# Patient Record
Sex: Female | Born: 1985 | Hispanic: No | Marital: Married | Smoking: Never smoker
Health system: Southern US, Community
[De-identification: ages and names within clinical notes are randomized; demographics above are authoritative.]

## PROBLEM LIST (undated history)

## (undated) DIAGNOSIS — L309 Dermatitis, unspecified: Secondary | ICD-10-CM

## (undated) HISTORY — DX: Dermatitis, unspecified: L30.9

## (undated) HISTORY — PX: WISDOM TOOTH EXTRACTION: SHX21

---
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
## 2015-10-11 ENCOUNTER — Emergency Department (HOSPITAL_COMMUNITY)
Admission: EM | Admit: 2015-10-11 | Discharge: 2015-10-11 | Disposition: A | Discharge status: Home / Self Care | Payer: BC Managed Care – PPO | Attending: Emergency Medicine | Admitting: Emergency Medicine

## 2015-10-11 ENCOUNTER — Emergency Department (HOSPITAL_COMMUNITY): Payer: BC Managed Care – PPO

## 2015-10-11 ENCOUNTER — Encounter (HOSPITAL_COMMUNITY): Payer: Self-pay | Admitting: Emergency Medicine

## 2015-10-11 DIAGNOSIS — Y999 Unspecified external cause status: Secondary | ICD-10-CM | POA: Diagnosis not present

## 2015-10-11 DIAGNOSIS — Z79899 Other long term (current) drug therapy: Secondary | ICD-10-CM | POA: Insufficient documentation

## 2015-10-11 DIAGNOSIS — R05 Cough: Secondary | ICD-10-CM

## 2015-10-11 DIAGNOSIS — R091 Pleurisy: Secondary | ICD-10-CM | POA: Insufficient documentation

## 2015-10-11 DIAGNOSIS — Y929 Unspecified place or not applicable: Secondary | ICD-10-CM | POA: Insufficient documentation

## 2015-10-11 DIAGNOSIS — Y9389 Activity, other specified: Secondary | ICD-10-CM | POA: Diagnosis not present

## 2015-10-11 DIAGNOSIS — X58XXXA Exposure to other specified factors, initial encounter: Secondary | ICD-10-CM | POA: Insufficient documentation

## 2015-10-11 DIAGNOSIS — Z7982 Long term (current) use of aspirin: Secondary | ICD-10-CM | POA: Insufficient documentation

## 2015-10-11 DIAGNOSIS — S29011A Strain of muscle and tendon of front wall of thorax, initial encounter: Secondary | ICD-10-CM | POA: Diagnosis not present

## 2015-10-11 DIAGNOSIS — R059 Cough, unspecified: Secondary | ICD-10-CM

## 2015-10-11 DIAGNOSIS — S299XXA Unspecified injury of thorax, initial encounter: Secondary | ICD-10-CM | POA: Diagnosis present

## 2015-10-11 LAB — BASIC METABOLIC PANEL
Anion gap: 9 (ref 5–15)
BUN: 10 mg/dL (ref 6–20)
CHLORIDE: 104 mmol/L (ref 101–111)
CO2: 25 mmol/L (ref 22–32)
CREATININE: 0.79 mg/dL (ref 0.44–1.00)
Calcium: 9.3 mg/dL (ref 8.9–10.3)
GFR calc non Af Amer: >60 mL/min (ref >60)
Glucose, Bld: 112 mg/dL — ABNORMAL HIGH (ref 65–99)
POTASSIUM: 3.9 mmol/L (ref 3.5–5.1)
SODIUM: 138 mmol/L (ref 135–145)

## 2015-10-11 LAB — CBC
HEMATOCRIT: 39.5 % (ref 36.0–46.0)
Hemoglobin: 13.3 g/dL (ref 12.0–15.0)
MCH: 30.2 pg (ref 26.0–34.0)
MCHC: 33.7 g/dL (ref 30.0–36.0)
MCV: 89.8 fL (ref 78.0–100.0)
PLATELETS: 376 10*3/uL (ref 150–400)
RBC: 4.4 MIL/uL (ref 3.87–5.11)
RDW: 12.7 % (ref 11.5–15.5)
WBC: 7.2 10*3/uL (ref 4.0–10.5)

## 2015-10-11 LAB — I-STAT TROPONIN, ED: Troponin i, poc: 0 ng/mL (ref 0.00–0.08)

## 2015-10-11 LAB — D-DIMER, QUANTITATIVE (NOT AT ARMC): D DIMER QUANT: 0.44 ug{FEU}/mL (ref 0.00–0.50)

## 2015-10-11 MED ORDER — IBUPROFEN 800 MG PO TABS: 800.0000 mg | ORAL_TABLET | Freq: Three times a day (TID) | ORAL | 0 refills | Status: AC

## 2015-10-11 MED ORDER — HYDROCODONE-HOMATROPINE 5-1.5 MG/5ML PO SYRP: 5.0000 mL | ORAL_SOLUTION | Freq: Four times a day (QID) | ORAL | 0 refills | Status: DC | PRN

## 2015-10-11 NOTE — ED Triage Notes (Signed)
Patient states that had left chest/ lateral rib pain x 5 days, thought it was from where she was coughing a lot. [facility] and was sent here for elevated D-dimer.

## 2015-10-11 NOTE — ED Notes (Signed)
PA at bedside.

## 2015-10-11 NOTE — ED Provider Notes (Signed)
[facility]-EMERGENCY DEPT Provider Note   CSN: [id number] Arrival date & time: 10/11/15  1451     History   Chief Complaint Chief Complaint  Patient presents with  . Chest Pain  . elevated D-dimer    HPI Jill Dawson is a 30 y.o. female.  Patient presents to the emergency department with chief complaint of cough and cold symptoms. She states that she has some left-sided chest pain when she is coughing. The pain is reproducible with movement and palpation. She denies any fevers chills. Denies shortness of breath. She denies any recent travel, surgery, or immobilization. Denies any history of PE or DVT. She denies any lower extremity swelling. She states that she does use oral contraception. She states that she was sent to the emergency department by [facility] for a d-dimer abnormality.   The history is provided by the patient. No language interpreter was used.    History reviewed. No pertinent past medical history.  There are no active problems to display for this patient.   History reviewed. No pertinent surgical history.  OB History    No data available       Home Medications    Prior to Admission medications   Medication Sig Start Date End Date Taking? Authorizing Provider  aspirin-acetaminophen-caffeine (EXCEDRIN MIGRAINE) [id number] MG tablet Take 2 tablets by mouth every 6 (six) hours as needed (chest pain.).   Yes Historical Provider, MD  cetirizine (ZYRTEC) 10 MG tablet Take 10 mg by mouth daily.   Yes Historical Provider, MD  ketotifen (ZADITOR) 0.025 % ophthalmic solution Place 2 drops into both eyes daily.   Yes Historical Provider, MD  Norethin Ace-Eth Estrad-FE (TAYTULLA) 1-20 MG-MCG(24) CAPS Take by mouth.   Yes Historical Provider, MD    Family History No family history on file.  Social History Social History  Substance Use Topics  . Smoking status: Never Smoker  . Smokeless tobacco: Not on file  . Alcohol use No     Allergies   Review of  patient's allergies indicates no known allergies.   Review of Systems Review of Systems  Respiratory: Negative for shortness of breath.   Cardiovascular: Positive for chest pain. Negative for leg swelling.  All other systems reviewed and are negative.    Physical Exam Updated Vital Signs BP 138/87 (BP Location: Right Arm)   Pulse 68   Temp 98.4 F (36.9 C)   Resp 13   Ht 5\' 7"  (1.702 m)   Wt 74.4 kg   SpO2 100%   BMI 25.69 kg/m   Physical Exam  Constitutional: She is oriented to person, place, and time. She appears well-developed and well-nourished.  HENT:  Head: Normocephalic and atraumatic.  Eyes: Conjunctivae and EOM are normal. Pupils are equal, round, and reactive to light.  Neck: Normal range of motion. Neck supple.  Cardiovascular: Normal rate and regular rhythm.  Exam reveals no gallop and no friction rub.   No murmur heard. Pulmonary/Chest: Effort normal and breath sounds normal. No respiratory distress. She has no wheezes. She has no rales. She exhibits no tenderness.  Clear to auscultation bilaterally  Abdominal: Soft. Bowel sounds are normal. She exhibits no distension and no mass. There is no tenderness. There is no rebound and no guarding.  Musculoskeletal: Normal range of motion. She exhibits no edema or tenderness.  No lower extremity swelling  Neurological: She is alert and oriented to person, place, and time.  Skin: Skin is warm and dry.  Psychiatric: She has a normal  mood and affect. Her behavior is normal. Judgment and thought content normal.  Nursing note and vitals reviewed.    ED Treatments / Results  Labs (all labs ordered are listed, but only abnormal results are displayed) Labs Reviewed  BASIC METABOLIC PANEL - Abnormal; Notable for the following:       Result Value   Glucose, Bld 112 (*)    All other components within normal limits  CBC  D-DIMER, QUANTITATIVE (NOT [facility])  Rosezena SensorI-STAT TROPOININ, ED    EKG  EKG Interpretation None         Radiology Dg Chest 2 View  Result Date: 10/11/2015 CLINICAL DATA:  Five day history of left-sided chest pain and cough. EXAM: CHEST  2 VIEW COMPARISON:  None. FINDINGS: The heart size and mediastinal contours are within normal limits. Both lungs are clear. The visualized skeletal structures are unremarkable. IMPRESSION: Normal chest x-ray. Electronically Signed   By: Rudie MeyerP.  Gallerani M.D.   On: 10/11/2015 15:53    Procedures Procedures (including critical care time)  Medications Ordered in ED Medications - No data to display   Initial Impression / Assessment and Plan / ED Course  I have reviewed the triage vital signs and the nursing notes.  Pertinent labs & imaging results that were available during my care of the patient were reviewed by me and considered in my medical decision making (see chart for details).  Clinical Course    [facility] having [facility] abnormal d-dimer. Her d-dimer to the emergency department is 0.44, which is normal. I have a very low suspicion of PE in this patient. She does not have any shortness of breath and tachycardia. Her symptoms are reproducible with palpation and when she is coughing. I suspect that her symptoms are more related to intercostal straining and possible pleurisy. Her laboratory workup is completely unremarkable.  I discussed the patient with Dr. Freida BusmanAllen, who agrees with the plan.  Final Clinical Impressions(s) / ED Diagnoses   Final diagnoses:  Intercostal muscle strain, initial encounter  Cough  Pleurisy    New Prescriptions New Prescriptions   HYDROCODONE-HOMATROPINE (HYCODAN) 5-1.5 MG/5ML SYRUP    Take 5 mLs by mouth every 6 (six) hours as needed for cough.   IBUPROFEN (ADVIL,MOTRIN) 800 MG TABLET    Take 1 tablet (800 mg total) by mouth 3 (three) times daily.     Roxy HorsemanRobert Lilie Vezina, PA-C 10/11/15 1914    Lorre NickAnthony Allen, MD 10/12/15 [phone]

## 2016-10-21 ENCOUNTER — Ambulatory Visit (INDEPENDENT_AMBULATORY_CARE_PROVIDER_SITE_OTHER): Payer: Self-pay | Admitting: Allergy and Immunology

## 2016-10-21 ENCOUNTER — Encounter: Payer: Self-pay | Admitting: Allergy and Immunology

## 2016-10-21 VITALS — BP 112/68 | HR 72 | Temp 98.7°F | Resp 16 | Ht 68.0 in | Wt 167.4 lb

## 2016-10-21 DIAGNOSIS — H1010 Acute atopic conjunctivitis, unspecified eye: Secondary | ICD-10-CM | POA: Insufficient documentation

## 2016-10-21 DIAGNOSIS — L500 Allergic urticaria: Secondary | ICD-10-CM | POA: Diagnosis not present

## 2016-10-21 DIAGNOSIS — J3089 Other allergic rhinitis: Secondary | ICD-10-CM | POA: Diagnosis not present

## 2016-10-21 DIAGNOSIS — H1013 Acute atopic conjunctivitis, bilateral: Secondary | ICD-10-CM

## 2016-10-21 MED ORDER — LEVOCETIRIZINE DIHYDROCHLORIDE 5 MG PO TABS: 5.0000 mg | ORAL_TABLET | Freq: Every evening | ORAL | 5 refills | Status: DC

## 2016-10-21 MED ORDER — FLUTICASONE PROPIONATE 93 MCG/ACT NA EXHU: 2.0000 | INHALANT_SUSPENSION | Freq: Two times a day (BID) | NASAL | 3 refills | Status: AC

## 2016-10-21 MED ORDER — OLOPATADINE HCL 0.7 % OP SOLN: 1.0000 [drp] | Freq: Every day | OPHTHALMIC | 5 refills | Status: AC

## 2016-10-21 NOTE — Patient Instructions (Addendum)
Allergic rhinitis with possible nonallergic component  Aeroallergen avoidance measures have been discussed and provided in written form.  A prescription [facility] been [facility], 2 actuations per nostril twice a day. Proper technique has been discussed and demonstrated.  I have also recommended nasal saline spray (i.e., Simply Saline) or nasal saline lavage (i.e., NeilMed) as needed and prior to medicated nasal sprays.  For thick post nasal drainage, nasal congestion, and/or sinus pressure, add guaifenesin 1200 mg (Mucinex Maximum Strength) plus/minus pseudoephedrine 120 mg  twice daily as needed with adequate hydration as discussed. Pseudoephedrine is only to be used for short-term relief of nasal/sinus congestion. Long-term use is discouraged due to potential side effects.  If allergen avoidance measures and medications fail to adequately relieve symptoms, aeroallergen immunotherapy will be considered.  Allergic conjunctivitis  Treatment plan as outlined above for allergic rhinitis.  A prescription has been provided for Pazeo, one drop per eye daily as needed.  I have also recommended eye lubricant drops (i.e., Natural Tears) as needed.  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 There are no concomitant symptoms concerning for anaphylaxis or constitutional symptoms worrisome for an underlying malignancy.   We will not order labs at this time, however, if lesions progress or change in character, we will assess other potential etiologies with screening labs.  For symptom relief, patient is to take oral antihistamines as directed.  Instructions have been discussed and provided for H1/H2 receptor blockade with titration to find lowest effective dose.  A prescription has been provided for levocetirizine, 5mg  daily as needed.  Should significant or new symptoms occur, a journal is to be kept recording any foods eaten, beverages  consumed, medications taken within a 6 hour period prior to the onset of symptoms, as well as record activities being performed, and environmental conditions. For any symptoms concerning for anaphylaxis, 911 is to be called immediately.   Return in about 3 months (around 01/20/2017), or if symptoms worsen or fail to improve.   Urticaria (Hives)  . Levocetirizine (Xyzal) 5 mg twice a day and ranitidine (Zantac) 150 mg twice a day. If no symptoms for 7-14 days then decrease to. . Levocetirizine (Xyzal) 5 mg twice a day and ranitidine (Zantac) 150 mg once a day.  If no symptoms for 7-14 days then decrease to. . Levocetirizine (Xyzal) 5 mg twice a day.  If no symptoms for 7-14 days then decrease to. . Levocetirizine (Xyzal) 5 mg once a day.  May use Benadryl (diphenhydramine) as needed for breakthrough symptoms       If symptoms return, then step up dosage    Reducing Pollen Exposure  The American Academy of Allergy, Asthma and Immunology suggests the following steps to reduce your exposure to pollen during allergy seasons.    1. Do not hang sheets or clothing out to dry; pollen may collect on these items. 2. Do not mow lawns or spend time around freshly cut grass; mowing stirs up pollen. 3. Keep windows closed at night.  Keep car windows closed while driving. 4. Minimize morning activities outdoors, a time when pollen counts are usually at their highest. 5. Stay indoors as much as possible when pollen counts or humidity is high and on windy days when pollen tends to remain in the air longer. 6. Use air conditioning when possible.  Many air conditioners have filters that trap the pollen spores. 7. Use a HEPA room air filter to remove pollen form the indoor air you breathe.  Control of House Dust Mite Allergen  House dust mites play a major role in allergic asthma and rhinitis.  They occur in environments with high humidity wherever human skin, the food for dust mites is found. High  levels have been detected in dust obtained from mattresses, pillows, carpets, upholstered furniture, bed covers, clothes and soft toys.  The principal allergen of the house dust mite is found in its feces.  A gram of dust may contain 1,000 mites and 250,000 fecal particles.  Mite antigen is easily measured in the air during house cleaning activities.    1. Encase mattresses, including the box spring, and pillow, in an air tight cover.  Seal the zipper end of the encased mattresses with wide adhesive tape. 2. Wash the bedding in water of 130 degrees Farenheit weekly.  Avoid cotton comforters/quilts and flannel bedding: the most ideal bed covering is the dacron comforter. 3. Remove all upholstered furniture from the bedroom. 4. Remove carpets, carpet padding, rugs, and non-washable window drapes from the bedroom.  Wash drapes weekly or use plastic window coverings. 5. Remove all non-washable stuffed toys from the bedroom.  Wash stuffed toys weekly. 6. Have the room cleaned frequently with a vacuum cleaner and a damp dust-mop.  The patient should not be in a room which is being cleaned and should wait 1 hour after cleaning before going into the room. 7. Close and seal all heating outlets in the bedroom.  Otherwise, the room will become filled with dust-laden air.  An electric heater can be used to heat the room. 8. Reduce indoor humidity to less than 50%.  Do not use a humidifier.  Control of Dog or Cat Allergen  Avoidance is the best way to manage a dog or cat allergy. If you have a dog or cat and are allergic to dog or cats, consider removing the dog or cat from the home. If you have a dog or cat but don't want to find it a new home, or if your family wants a pet even though someone in the household is allergic, here are some strategies that may help keep symptoms at bay:  1. Keep the pet out of your bedroom and restrict it to only a few rooms. Be advised that keeping the dog or cat in only one room  will not limit the allergens to that room. 2. Don't pet, hug or kiss the dog or cat; if you do, wash your hands with soap and water. 3. High-efficiency particulate air (HEPA) cleaners run continuously in a bedroom or living room can reduce allergen levels over time. 4. Place electrostatic material sheet in the air inlet vent in the bedroom. 5. Regular use of a high-efficiency vacuum cleaner or a central vacuum can reduce allergen levels. 6. Giving your dog or cat a bath at least once a week can reduce airborne allergen.  Control of Mold Allergen  Mold and fungi can grow on a variety of surfaces provided certain temperature and moisture conditions exist.  Outdoor molds grow on plants, decaying vegetation and soil.  The major outdoor mold, Alternaria and Cladosporium, are found in very high numbers during hot and dry conditions.  Generally, a late Summer - Fall peak is seen for common outdoor fungal spores.  Rain will temporarily lower outdoor mold spore count, but counts rise rapidly when the rainy period ends.  The most important indoor molds are Aspergillus and Penicillium.  Dark, humid and poorly ventilated basements are ideal sites for mold growth.  The next most common sites of mold growth are the bathroom and the kitchen.  Outdoor [employer] 1. Use air conditioning and keep windows closed 2. Avoid exposure to decaying vegetation. 3. Avoid leaf raking. 4. Avoid grain handling. 5. Consider wearing a face mask if working in moldy areas.  Indoor Mold Control 1. Maintain humidity below 50%. 2. Clean washable surfaces with 5% bleach solution. 3. Remove sources e.g. Contaminated carpets.  Control of Cockroach Allergen  Cockroach allergen has been identified as an important cause of acute attacks of asthma, especially in urban settings.  There are fifty-five species of cockroach that exist in the [place], however only three, the [place], [place] species produce allergen  that can affect patients with Asthma.  Allergens can be obtained from fecal particles, egg casings and secretions from cockroaches.    1. Remove food sources. 2. Reduce access to water. 3. Seal access and entry points. 4. Spray runways with 0.5-1% Diazinon or Chlorpyrifos 5. Blow boric acid power under stoves and refrigerator. 6. Place bait stations (hydramethylnon) at feeding sites.

## 2016-10-21 NOTE — Assessment & Plan Note (Signed)
   Aeroallergen avoidance measures have been discussed and provided in written form.  A prescription has been [facility], 2 actuations per nostril twice a day. Proper technique has been discussed and demonstrated.  I have also recommended nasal saline spray (i.e., Simply Saline) or nasal saline lavage (i.e., NeilMed) as needed and prior to medicated nasal sprays.  For thick post nasal drainage, nasal congestion, and/or sinus pressure, add guaifenesin 1200 mg (Mucinex Maximum Strength) plus/minus pseudoephedrine 120 mg  twice daily as needed with adequate hydration as discussed. Pseudoephedrine is only to be used for short-term relief of nasal/sinus congestion. Long-term use is discouraged due to potential side effects.  If allergen avoidance measures and medications fail to adequately relieve symptoms, aeroallergen immunotherapy will be considered.

## 2016-10-21 NOTE — Progress Notes (Signed)
New Patient Note  RE: Jill Dawson MRN: [id number] DOB: 1985/05/09 Date of Office Visit: 10/21/2016  Referring provider: No ref. provider found Primary care provider: Patient, No Pcp Per  Chief Complaint: Sinus Problem; Nasal Congestion; Pruritus; and Urticaria   History of present illness: Jill DaubVictoria Dawson is a 31 y.o. female presenting today for evaluation of rhinosinusitis, rash, and pruritus.  Over the past 10 years she has experienced recurrent sinus pressure and nasal congestion.  The sinus pressure primarily occurs over her forehead and between her eyes.  She states that if she is unable to resolve the congestion and pressure she will progressed into a "full blown" migraine headache.  Specific triggers for the congestion and sinus pressure include exposure to pollen, dust, cigarette smoke, and rapid weather changes.  She also experiences rhinorrhea, sneezing, nasal pruritus, and ocular pruritus.  These specific symptoms are most frequent and severe during the spring and fall with pollen exposure.  She attempts to control her nasal, sinus, and ocular symptoms with loratadine daily, Zaditor as needed, pseudoephedrine as needed, and fluticasone nasal spray sporadically. She reports that since July 4 she has experienced consistent pruritus as well as occasional small, red, raised bumps on her neck.  Since that time her eczema has been exacerbated as well.  She has had eczema since infancy and is followed by a dermatologist for this problem.  The eczema typically involves her eyelids, neck, and upper extremities.  She was started on Cloderm last week but it is too early to assess benefit.  She also notes that when she scratches her skin she develops linear welts or, if she is carrying a heavy purse/bag, often times hives develop at the point of pressure from the strap.  She does not experience concomitant angioedema, cardiopulmonary symptoms, or GI symptoms.   Assessment and  plan: Allergic rhinitis with possible nonallergic component  Aeroallergen avoidance measures have been discussed and provided in written form.  [facility] 2 actuations per nostril twice a day. Proper technique has been discussed and demonstrated.  I have also recommended nasal saline spray (i.e., Simply Saline) or nasal saline lavage (i.e., NeilMed) as needed and prior to medicated nasal sprays.  For thick post nasal drainage, nasal congestion, and/or sinus pressure, add guaifenesin 1200 mg (Mucinex Maximum Strength) plus/minus pseudoephedrine 120 mg  twice daily as needed with adequate hydration as discussed. Pseudoephedrine is only to be used for short-term relief of nasal/sinus congestion. Long-term use is discouraged due to potential side effects.  If allergen avoidance measures and medications fail to adequately relieve symptoms, aeroallergen immunotherapy will be considered.  Allergic conjunctivitis  Treatment plan as outlined above for allergic rhinitis.  A prescription has been provided for Pazeo, one drop per eye daily as needed.  I have also recommended eye lubricant drops (i.e., Natural Tears) as needed.  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 There are no concomitant symptoms concerning for anaphylaxis or constitutional symptoms worrisome for an underlying malignancy.   We will not order labs at this time, however, if lesions progress or change in character, we will assess other potential etiologies with screening labs.  For symptom relief, patient is to take oral antihistamines as directed.  Instructions have been discussed and provided for H1/H2 receptor blockade with titration to find lowest effective dose.  A prescription has been provided for levocetirizine, 5mg  daily as needed.  Should significant or new symptoms occur, a journal is to be kept  recording any foods eaten,  beverages consumed, medications taken within a 6 hour period prior to the onset of symptoms, as well as record activities being performed, and environmental conditions. For any symptoms concerning for anaphylaxis, 911 is to be called immediately.   Meds ordered this encounter  Medications  . Fluticasone Propionate (XHANCE) 93 MCG/ACT EXHU    Sig: Place 2 sprays into the nose 2 (two) times daily.    Dispense:  16 mL    Refill:  3    [phone]  . levocetirizine (XYZAL) 5 MG tablet    Sig: Take 1 tablet (5 mg total) by mouth every evening.    Dispense:  30 tablet    Refill:  5  . Olopatadine HCl (PAZEO) 0.7 % SOLN    Sig: Apply 1 drop to eye daily.    Dispense:  2.5 mL    Refill:  5    Diagnostics: Environmental skin testing: Positive to ragweed pollen, weed pollen, tree pollen, molds, cat hair, dog epithelia, cockroach antigen, and dust mite antigen. Food allergen skin testing:  Negative despite a positive histamine control.    Physical examination: Blood pressure 112/68, pulse 72, temperature 98.7 F (37.1 C), temperature source Oral, resp. rate 16, height 5\' 8"  (1.727 m), weight 167 lb 6.4 oz (75.9 kg).  General: Alert, interactive, in no acute distress. HEENT: TMs pearly gray, turbinates moderately edematous without discharge, post-pharynx unremarkable. Neck: Supple without lymphadenopathy. Lungs: Clear to auscultation without wheezing, rhonchi or rales. CV: Normal S1, S2 without murmurs. Abdomen: Nondistended, nontender. Skin: Warm and dry, without lesions or rashes. Extremities:  No clubbing, cyanosis or edema. Neuro:   Grossly intact.  Review of systems:  Review of systems negative except as noted in HPI / PMHx or noted below: Review of Systems  Constitutional: Negative.   HENT: Negative.   Eyes: Negative.   Respiratory: Negative.   Cardiovascular: Negative.   Gastrointestinal: Negative.   Genitourinary: Negative.   Musculoskeletal: Negative.   Skin: Negative.    Neurological: Negative.   Endo/Heme/Allergies: Negative.   Psychiatric/Behavioral: Negative.     Past medical history:  Past Medical History:  Diagnosis Date  . Eczema     Past surgical history:  Past Surgical History:  Procedure Laterality Date  . WISDOM TOOTH EXTRACTION      Family history: Family History  Problem Relation Age of Onset  . Allergic rhinitis Mother   . Allergic rhinitis Father   . Angioedema Neg Hx   . Asthma Neg Hx   . Eczema Neg Hx   . Immunodeficiency Neg Hx   . Urticaria Neg Hx     Social history: Social History   Social History  . Marital status: Married    Spouse name: N/A  . Number of children: N/A  . Years of education: N/A   Occupational History  . Not on file.   Social History Main Topics  . Smoking status: Never Smoker  . Smokeless tobacco: Never Used  . Alcohol use No  . Drug use: No  . Sexual activity: Not on file   Other Topics Concern  . Not on file   Social History Narrative  . No narrative on file   Environmental History: The patient lives in a house built in [place] with hardwood floors throughout and central air/heat.  There are 2 dogs in the home which have access to her bedroom.  Mold has been identified, however only in the basement.  She is a nonsmoker.  Allergies as  of 10/21/2016   No Known Allergies     Medication List       Accurate as of 10/21/16  1:22 PM. Always use your most recent med list.          aspirin-acetaminophen-caffeine 250-250-65 MG tablet Commonly known as:  EXCEDRIN MIGRAINE Take 2 tablets by mouth every 6 (six) hours as needed (chest pain.).   Clocortolone Pivalate 0.1 % cream Commonly known as:  CLODERM   Fluticasone Propionate 93 MCG/ACT Exhu Commonly known as:  XHANCE Place 2 sprays into the nose 2 (two) times daily.   ibuprofen 800 MG tablet Commonly known as:  ADVIL,MOTRIN Take 1 tablet (800 mg total) by mouth 3 (three) times daily.   ketotifen 0.025 % ophthalmic  solution Commonly known as:  ZADITOR Place 2 drops into both eyes daily.   levocetirizine 5 MG tablet Commonly known as:  XYZAL Take 1 tablet (5 mg total) by mouth every evening.   loratadine 10 MG tablet Commonly known as:  CLARITIN Take 10 mg by mouth daily.   Olopatadine HCl 0.7 % Soln Commonly known as:  PAZEO Apply 1 drop to eye daily.   TAYTULLA 1-20 MG-MCG(24) Caps Generic drug:  Norethin Ace-Eth Estrad-FE Take by mouth.            Discharge Care Instructions        Start     Ordered   10/21/16 0000  Allergy Test    Question:  Allergy test to perform  Answer:  1-59 airborne; 1-30 child food   10/21/16 1219   10/21/16 0000  Interdermal Allergy Test    Question Answer Comment  Allergens Control   Allergens [place]   Allergens Johnson   Allergens 7 Grass   Allergens Ragweed Mix   Allergens Weed Mix   Allergens Tree Mix   Allergens Mold 1   Allergens Mold 2   Allergens Mold 3   Allergens Mold 4   Allergens Cat   Allergens Dog   Allergens Cockroach   Allergens Mite Mix      10/21/16 1219   10/21/16 0000  Fluticasone Propionate (XHANCE) 93 MCG/ACT EXHU  2 times daily    Comments:  [id number]   10/21/16 1219   10/21/16 0000  levocetirizine (XYZAL) 5 MG tablet  Every evening     10/21/16 1219   10/21/16 0000  Olopatadine HCl (PAZEO) 0.7 % SOLN  Daily     10/21/16 1219      Known medication allergies: No Known Allergies  I appreciate the opportunity to take part in [place]'s care. Please do not hesitate to contact me with questions.  Sincerely,   R. Jorene Guest, MD

## 2016-10-21 NOTE — Assessment & Plan Note (Signed)
   Treatment plan as outlined above for allergic rhinitis.  A prescription has been provided for Pazeo, one drop per eye daily as needed.  I have also recommended eye lubricant drops (i.e., Natural Tears) as needed. 

## 2016-10-21 NOTE — Assessment & Plan Note (Addendum)
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 There are no concomitant symptoms concerning for anaphylaxis or constitutional symptoms worrisome for an underlying malignancy.   We will not order labs at this time, however, if lesions progress or change in character, we will assess other potential etiologies with screening labs.  For symptom relief, patient is to take oral antihistamines as directed.  Instructions have been discussed and provided for H1/H2 receptor blockade with titration to find lowest effective dose.  A prescription has been provided for levocetirizine, 5mg  daily as needed.  Should significant or new symptoms occur, a journal is to be kept recording any foods eaten, beverages consumed, medications taken within a 6 hour period prior to the onset of symptoms, as well as record activities being performed, and environmental conditions. For any symptoms concerning for anaphylaxis, 911 is to be called immediately.

## 2017-01-19 ENCOUNTER — Encounter: Payer: Self-pay | Admitting: Allergy and Immunology

## 2017-01-19 ENCOUNTER — Ambulatory Visit: Payer: BC Managed Care – PPO | Admitting: Allergy and Immunology

## 2017-01-19 VITALS — BP 117/75 | HR 75 | Temp 98.0°F | Resp 16

## 2017-01-19 DIAGNOSIS — L500 Allergic urticaria: Secondary | ICD-10-CM | POA: Diagnosis not present

## 2017-01-19 DIAGNOSIS — L209 Atopic dermatitis, unspecified: Secondary | ICD-10-CM | POA: Insufficient documentation

## 2017-01-19 DIAGNOSIS — H1013 Acute atopic conjunctivitis, bilateral: Secondary | ICD-10-CM | POA: Diagnosis not present

## 2017-01-19 DIAGNOSIS — J3089 Other allergic rhinitis: Secondary | ICD-10-CM

## 2017-01-19 DIAGNOSIS — L2089 Other atopic dermatitis: Secondary | ICD-10-CM

## 2017-01-19 MED ORDER — LEVOCETIRIZINE DIHYDROCHLORIDE 5 MG PO TABS: 5.0000 mg | ORAL_TABLET | Freq: Every evening | ORAL | 5 refills | Status: DC

## 2017-01-19 NOTE — Assessment & Plan Note (Signed)
Quiescent in the interval since her previous visit.  Continue levocetirizine or fexofenadine if needed.  Should significant symptoms recur, a journal is to be kept recording any foods eaten, beverages consumed, and medications taken within a 6 hour time period prior to the onset of symptoms, as well as record activities being performed, and environmental conditions. For any symptoms concerning for anaphylaxis, 911 is to be called immediately.

## 2017-01-19 NOTE — Progress Notes (Signed)
Follow-up Note  RE: Jill Dawson MRN: [id number] DOB: 1985/04/05 Date of Office Visit: 01/19/2017  Primary care provider: Patient, No Pcp Per Referring provider: No ref. provider found  History of present illness: Jill Dawson is a 31 y.o. female with allergic rhinoconjunctivitis and history of urticaria presenting today for follow-up.  She was previously seen in this clinic for her initial evaluation on October 21, 2016.  Ports that her allergic rhinitis symptoms have been well controlled with Xyzal or Allegra as well as sporadic use of Xhance.  She is using nasal saline irrigation as well.  She has noticed since our discussion on her previous visit that barometric pressure changes seem to trigger her sinus headaches.  She has had no recurrence of urticaria in the interval since her previous visit.  She reports that her eczema has been occurring more frequently this year, particularly on her neck and back.  She is using Cloderm as needed and moisturizes her skin appropriately.   Assessment and plan: Allergic rhinitis with possible nonallergic component  Continue appropriate allergen avoidance measures, levocetirizine (Xyzal) 5 mg daily as needed or fexofenadine (Allegra) 180 mg daily as needed, nasal saline irrigation, and Xhance as needed.  Atopic dermatitis  Continue appropriate skin care measures and Cloderm 0.1% cream to affected areas as needed.  Recurrent urticaria Quiescent in the interval since her previous visit.  Continue levocetirizine or fexofenadine if needed.  Should significant symptoms recur, a journal is to be kept recording any foods eaten, beverages consumed, and medications taken within a 6 hour time period prior to the onset of symptoms, as well as record activities being performed, and environmental conditions. For any symptoms concerning for anaphylaxis, 911 is to be called immediately.   Meds ordered this encounter  Medications  . levocetirizine  (XYZAL) 5 MG tablet    Sig: Take 1 tablet (5 mg total) by mouth every evening.    Dispense:  30 tablet    Refill:  5    Physical examination: Blood pressure 117/75, pulse 75, temperature 98 F (36.7 C), temperature source Oral, resp. rate 16, SpO2 98 %.  General: Alert, interactive, in no acute distress. HEENT: TMs pearly gray, turbinates mildly edematous without discharge, post-pharynx unremarkable. Neck: Supple without lymphadenopathy. Lungs: Clear to auscultation without wheezing, rhonchi or rales. CV: Normal S1, S2 without murmurs. Skin: Warm and dry, without lesions or rashes.  The following portions of the patient's history were reviewed and updated as appropriate: allergies, current medications, past family history, past medical history, past social history, past surgical history and problem list.  Allergies as of 01/19/2017   No Known Allergies     Medication List        Accurate as of 01/19/17 10:52 AM. Always use your most recent med list.          amoxicillin-clavulanate 875-125 MG tablet Commonly known as:  AUGMENTIN   aspirin-acetaminophen-caffeine 250-250-65 MG tablet Commonly known as:  EXCEDRIN MIGRAINE Take 2 tablets by mouth every 6 (six) hours as needed (chest pain.).   BOOSTRIX 5-2.5-18.5 LF-MCG/0.5 injection Generic drug:  Tdap ADM 0.5ML IM UTD   Clocortolone Pivalate 0.1 % cream Commonly known as:  CLODERM   Fluticasone Propionate 93 MCG/ACT Exhu Commonly known as:  XHANCE Place 2 sprays into the nose 2 (two) times daily.   FLUZONE QUADRIVALENT 0.5 ML injection Generic drug:  Influenza vac split quadrivalent PF   ibuprofen 800 MG tablet Commonly known as:  ADVIL,MOTRIN Take 1 tablet (800 mg  total) by mouth 3 (three) times daily.   ketotifen 0.025 % ophthalmic solution Commonly known as:  ZADITOR Place 2 drops into both eyes daily.   levocetirizine 5 MG tablet Commonly known as:  XYZAL Take 1 tablet (5 mg total) by mouth every  evening.   loratadine 10 MG tablet Commonly known as:  CLARITIN Take 10 mg by mouth daily.   Olopatadine HCl 0.7 % Soln Commonly known as:  PAZEO Apply 1 drop to eye daily.   TAYTULLA 1-20 MG-MCG(24) Caps Generic drug:  Norethin Ace-Eth Estrad-FE Take by mouth.       No Known Allergies  I appreciate the opportunity to take part in [name]'s care. Please do not hesitate to contact me with questions.  Sincerely,   R. Jorene Guestarter Raelyn Racette, MD

## 2017-01-19 NOTE — Assessment & Plan Note (Signed)
   Continue appropriate allergen avoidance measures, levocetirizine (Xyzal) 5 mg daily as needed or fexofenadine (Allegra) 180 mg daily as needed, nasal saline irrigation, and Xhance as needed.

## 2017-01-19 NOTE — Patient Instructions (Signed)
Allergic rhinitis with possible nonallergic component  Continue appropriate allergen avoidance measures, levocetirizine (Xyzal) 5 mg daily as needed or fexofenadine (Allegra) 180 mg daily as needed, nasal saline irrigation, and Xhance as needed.  Atopic dermatitis  Continue appropriate skin care measures and Cloderm 0.1% cream to affected areas as needed.  Recurrent urticaria Quiescent in the interval since her previous visit.  Continue levocetirizine or fexofenadine if needed.  Should significant symptoms recur, a journal is to be kept recording any foods eaten, beverages consumed, and medications taken within a 6 hour time period prior to the onset of symptoms, as well as record activities being performed, and environmental conditions. For any symptoms concerning for anaphylaxis, 911 is to be called immediately.   Return in about 6 months (around 07/20/2017), or if symptoms worsen or fail to improve.

## 2017-01-19 NOTE — Assessment & Plan Note (Signed)
   Continue appropriate skin care measures and Cloderm 0.1% cream to affected areas as needed.

## 2017-05-28 IMAGING — CR DG CHEST 2V
2 series · 2 of 2 positions shown · non-contrast
Comparison: None.

CLINICAL DATA: Five day history of left-sided chest pain and cough.

EXAM:
CHEST  2 VIEW

[w chest lat]
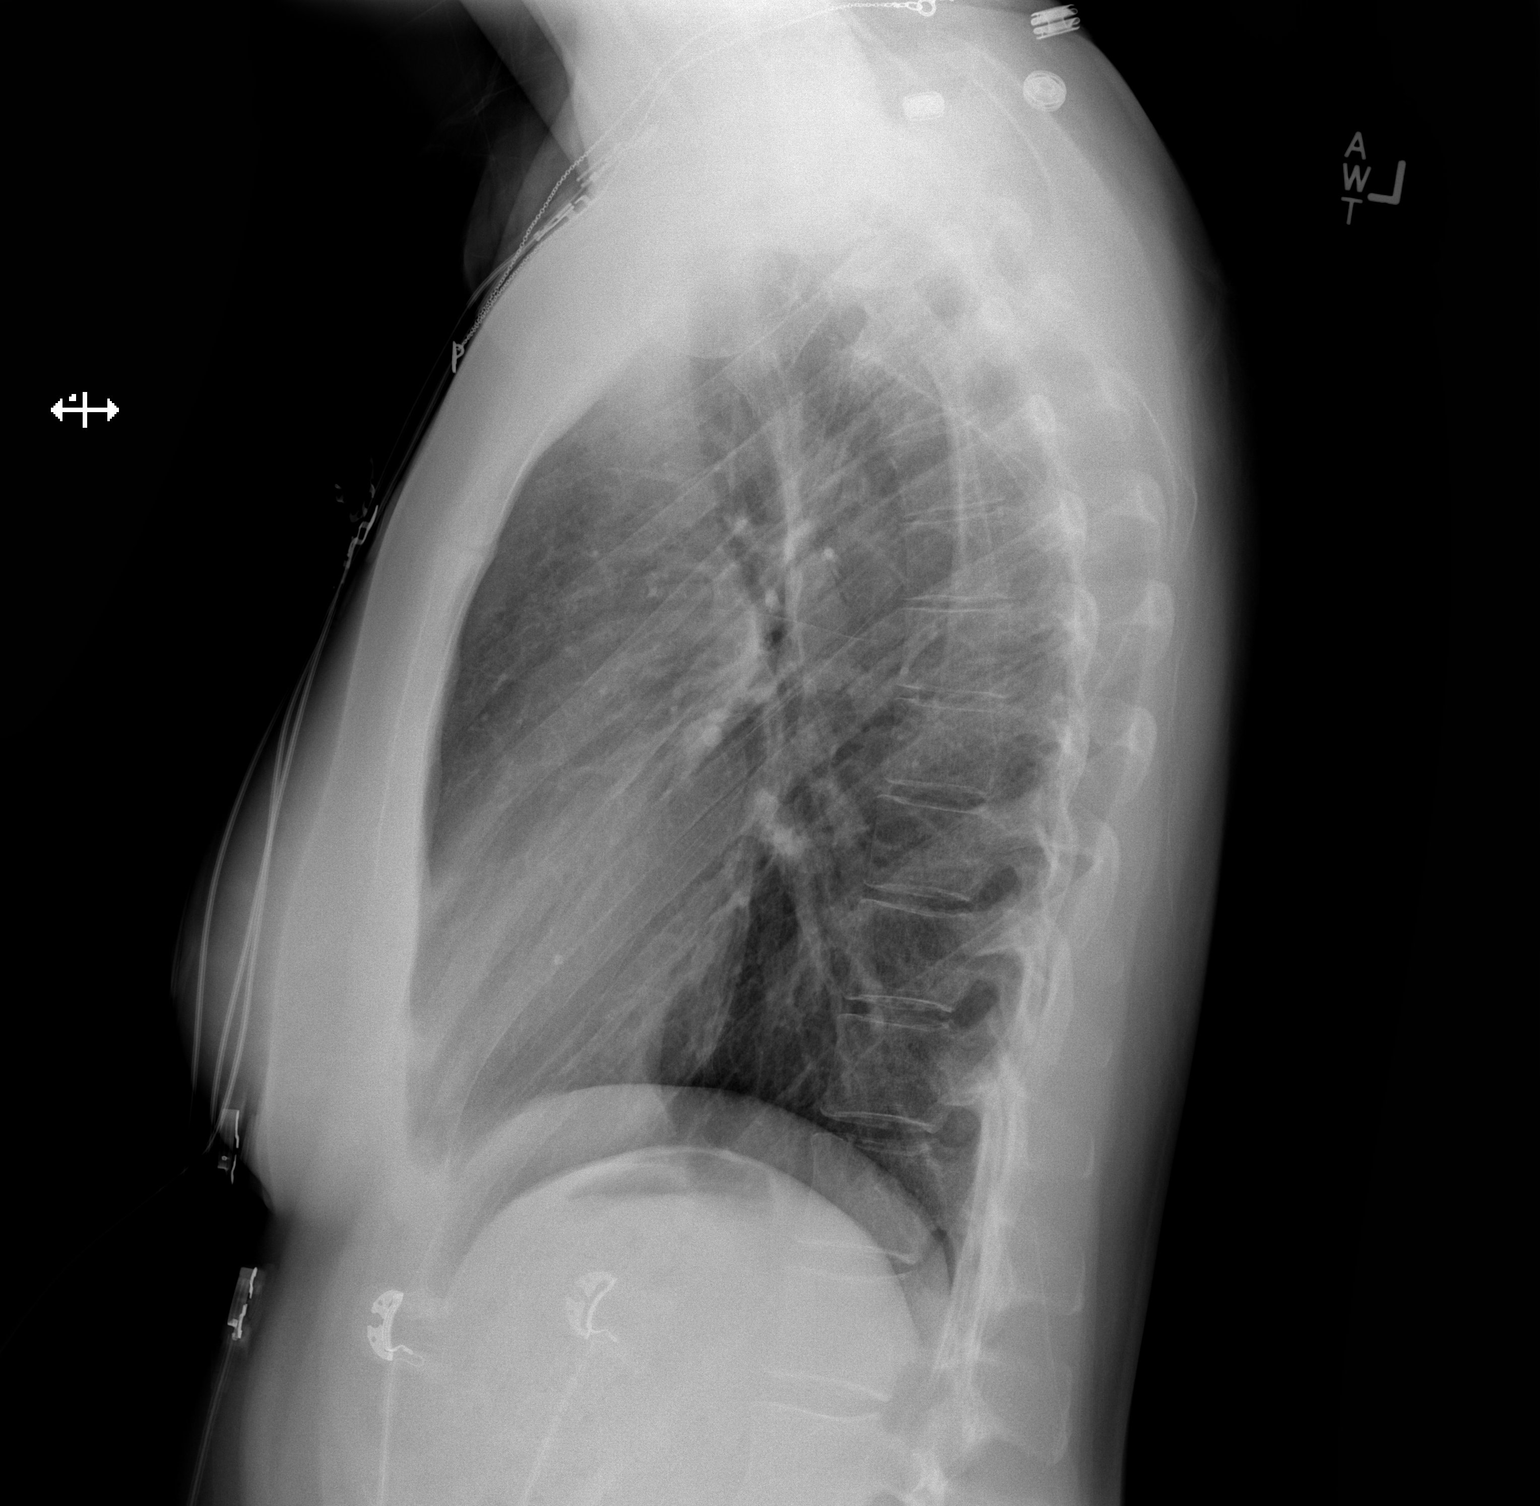

[w chest pa]
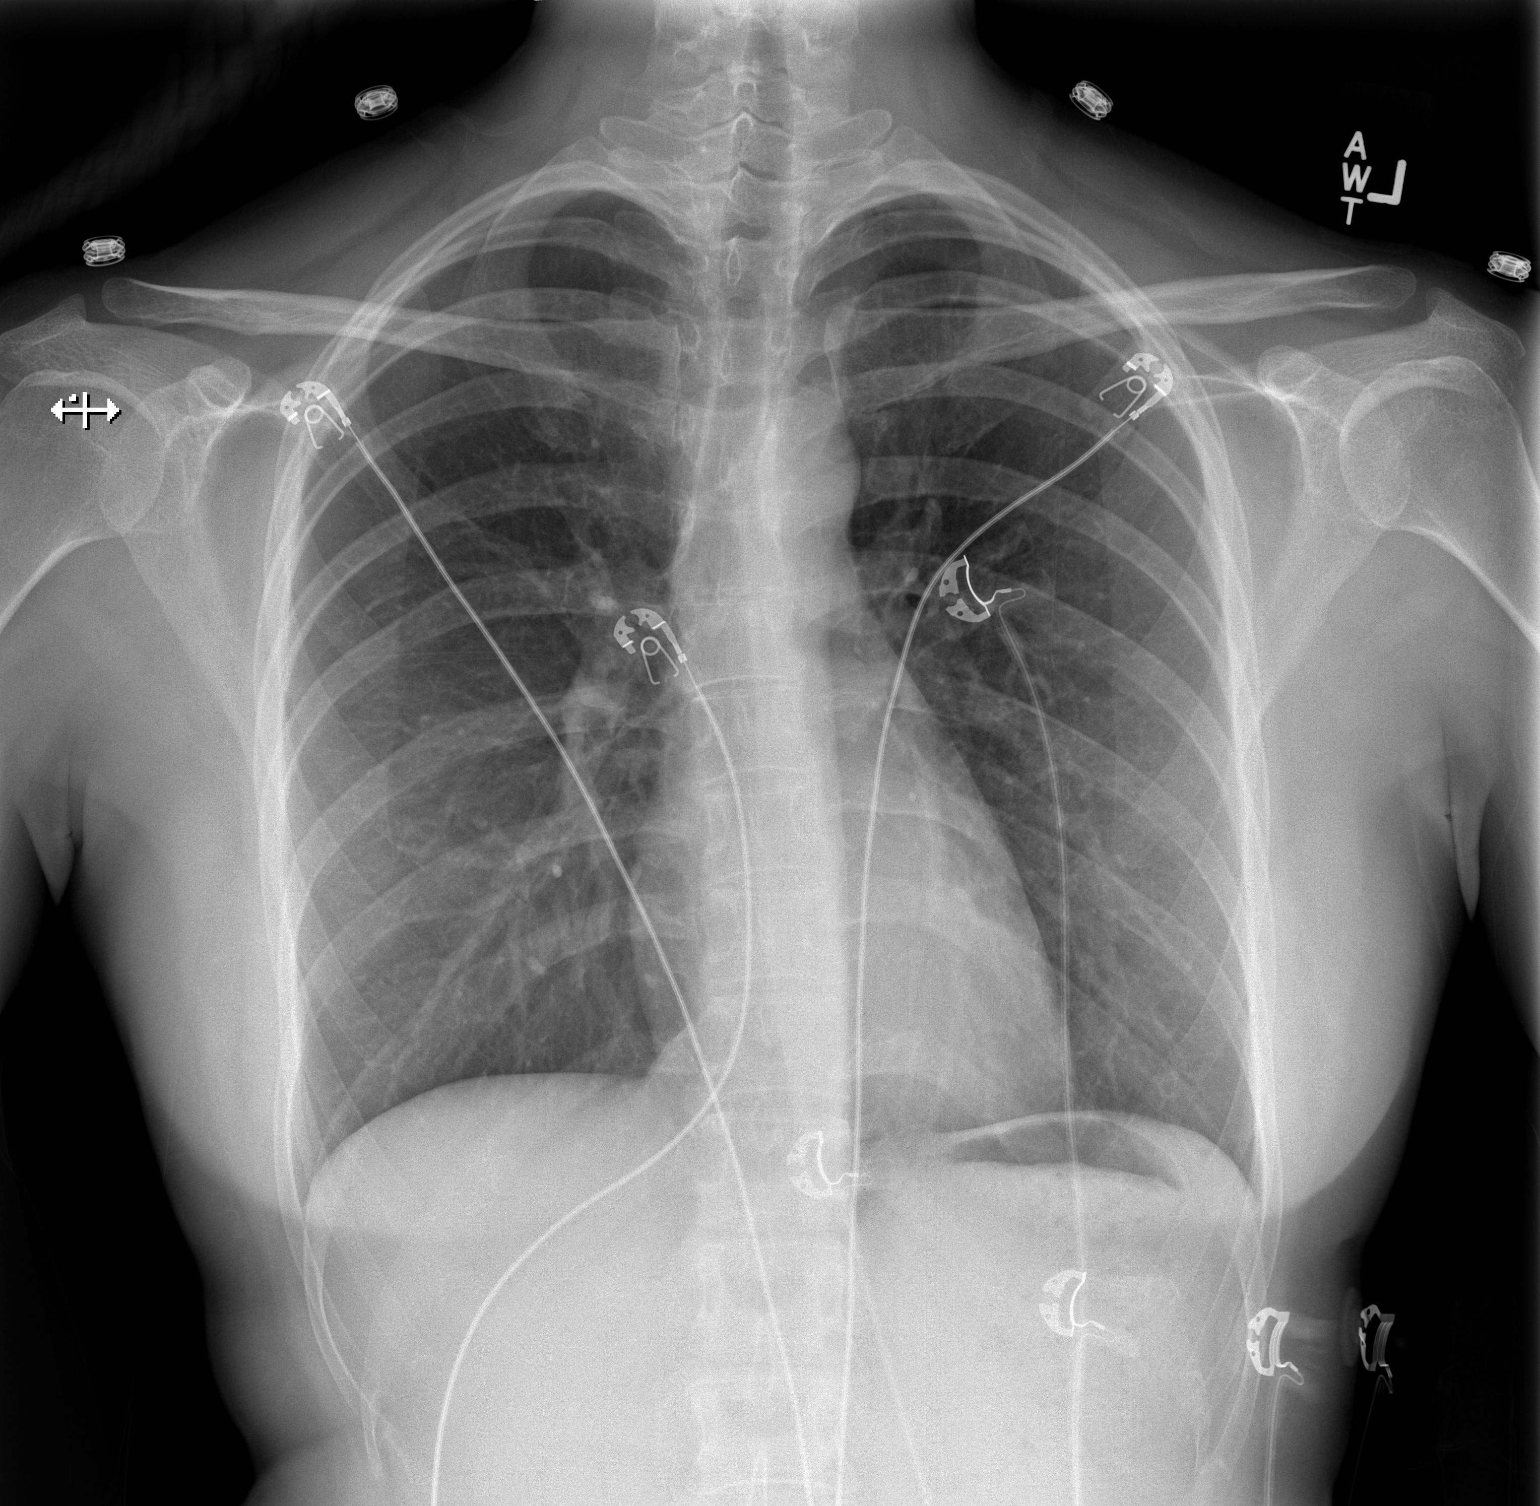

[2 of 2 positions shown; findings below may reference images not displayed]

FINDINGS: The heart size and mediastinal contours are within normal limits.
Both lungs are clear. The visualized skeletal structures are
unremarkable.
IMPRESSION: Normal chest x-ray.

## 2017-08-08 ENCOUNTER — Other Ambulatory Visit: Payer: Self-pay | Admitting: Allergy and Immunology

## 2017-08-08 DIAGNOSIS — L500 Allergic urticaria: Secondary | ICD-10-CM

## 2017-08-08 DIAGNOSIS — H1013 Acute atopic conjunctivitis, bilateral: Secondary | ICD-10-CM

## 2017-08-08 DIAGNOSIS — J3089 Other allergic rhinitis: Secondary | ICD-10-CM

## 2017-11-10 ENCOUNTER — Other Ambulatory Visit: Payer: Self-pay | Admitting: Allergy and Immunology

## 2017-11-10 DIAGNOSIS — J3089 Other allergic rhinitis: Secondary | ICD-10-CM

## 2017-11-10 DIAGNOSIS — L500 Allergic urticaria: Secondary | ICD-10-CM

## 2017-11-10 DIAGNOSIS — H1013 Acute atopic conjunctivitis, bilateral: Secondary | ICD-10-CM

## 2017-12-12 ENCOUNTER — Other Ambulatory Visit: Payer: Self-pay | Admitting: Allergy and Immunology

## 2017-12-12 DIAGNOSIS — J3089 Other allergic rhinitis: Secondary | ICD-10-CM

## 2017-12-12 DIAGNOSIS — L500 Allergic urticaria: Secondary | ICD-10-CM

## 2017-12-12 DIAGNOSIS — H1013 Acute atopic conjunctivitis, bilateral: Secondary | ICD-10-CM

## 2017-12-21 ENCOUNTER — Other Ambulatory Visit: Payer: Self-pay

## 2017-12-21 DIAGNOSIS — J3089 Other allergic rhinitis: Secondary | ICD-10-CM

## 2017-12-21 DIAGNOSIS — H1013 Acute atopic conjunctivitis, bilateral: Secondary | ICD-10-CM

## 2017-12-21 DIAGNOSIS — L500 Allergic urticaria: Secondary | ICD-10-CM

## 2018-06-25 ENCOUNTER — Telehealth: Payer: Commercial Managed Care - HMO

## 2018-06-25 NOTE — Telephone Encounter
Independent Living Donor Advocate Introduction Contact  Donor:  Called and spoke to [place].  Intervention:    Introduced the [employer] role.  We   discussed scheduling an assessment once she has completed the majority of the work-up.  She is agreeable and she will be anticipating my call to schedule an appointment for ILDA assessment.   Plan:  Will plan to contact this prospective donor in the near future, once she has completed the majority of the work-up.    This prospective donor has been provided my contact information and she understands that I will remain available for any assistance, questions and referrals.    Mellissa Kohut, MSW   Independent Living Donor Advocate (ILDA)  Kidney and Pancreas  Transplant Programs

## 2018-06-28 ENCOUNTER — Telehealth: Payer: PRIVATE HEALTH INSURANCE

## 2018-06-28 NOTE — Telephone Encounter
Called Brenda Horn for paired exchange program information and to discuss advance donation as [name] expressed to [facility] [place] that she wants to be her spouse's care provider post transplant as well as his living kidney donor. TC updated to me from [facility] [place]. Did not connect with Brenda Horn this morning. Left voicemail requesting she call back to proceed further and to discuss any questions that she may have.

## 2018-06-28 NOTE — Telephone Encounter
Brenda Horn living kidney donor candidate for her spouse Brenda Horn. Blood type unknown. Lives in [place], [place]43[place] from [place].     33 y/o [place], Caucasian Female.     Ht   170 cm (5' 7'')  Wt   79.3 kg (175 lbs)  BMI   27.40 kg/m???    Medical History   Allergic Rhinitis    Medications   Allegra-D 24 Hour  Junel Fe 1/20  Melatonin  Probiotic    Surgical History   None Reported    Allergies   NKDA    Social History   EtOH: Rare (1-2 drinks/month)  Illicit Drug Use: Denies  No biological children  Tobacco: Denies    Self-Reported Functional Status   ''I am able to participate in moderate recreational activities like golf, double tennis, dancing, throwing a baseball or football''    Family Medical History   Cancer (denies)  Diabetes (denies)  Heart Disease (denies)  Hypertension (denies)  Kidney Disease (denies)  Kidney Stones (denies)  Lupus (denies)    Exercise Frequency   Exercise (Not on a regular basis)    Marital Status   Married    Medical sales representative   Lives in rented accommodation    Level of Education   Graduate or professional degree complete    Living Arrangement   With spouse    Employment Status   Full Time    Willing to Accept Blood Transfusion   Yes  Employer   [employer]       Occupation   Sports coach Status   Has medical insurance       Past Incarceration   No       Previous Donation Application   No     High Risk Behaviors negative on initial assessment, including:    Are you a woman who has had sex with a man with a history of MSM behavior in the preceding 12 months? (NO)  Have you been in lockup, jail, prison, or a juvenile correctional facility for more than 72 consecutive hours in the preceding 12 months? (NO)  Have you been newly diagnosed with, or have been treated for, syphilis, gonorrhea, chlamydia or genital ulcers in the preceding 12 months? (NO)  Have you been on hemodialysis in the preceding 12 months? (NO) Have you had sex in exchange for money or drugs in the preceding 12 months? (NO)  Have you had sex with a person known or suspected to have HIV, HBV, and/or HCV infections(s) in the preceding 12 months? (NO)  Have you had sex with a person who had sex in exchange for money or drugs in the preceding 12 months? (NO)  Have you had sex with a person who injected drugs by intravenous, intramuscular, or subcutaneous route for nonmedical reason in the preceding 12 months? (NO)  Have you injected drugs by intravenous, intramuscular, or subcutaneous route for nonmedical reason in the preceding 12 months? (NO)    ABO O. Recip Tele Health eval 06/15/18 ([place])    06/21/18 - reviewed Intake with 33 yo donor intending to donate on behalf of her husband. Confirmed that recipient intends to be dual listed at [facility] and at transplant center in [place]. She has not done any donor testing in [place]. Donor stated she is aware she would need to go through PEX because she is ABOI to recipient. She is aware recipient/husband is O pos.  Donor thinks her ABO is AB. Explained that case will be transferred to [facility] TC J. Jahmani Staup.    Confirmed 5'7'' and 175 lb, BMI 27.4. Takes Junel Fe 1/20, Allegra, Melatonin, and prebiotic. NKDA. No surgical hx.    Denies personal and family hx of DM, HTN, heart or kidney disease, or CA. Hx prior UTIs, last was in 2012. Does not have children. Explained increased risk of pre-eclampsia. Donor stated she and her husband likely do not want future pregnancies.    Reports 1-2 alcoholic drinks/month. Denies hx of tobacco or drug use. Does not remember last physical with PCP. Sees Gynecologist annually. Annual pap smear required for Birth control renewal, most recent in 2019. Has lived in the [place] only. Denies hx of TB. 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    Briefly explained Hybrid donor eval. Advised that P[facility] TC Victorino Dike Roman Dubuc will call donor to review PEX options and begin scheduling donor eval. Donor stated she will be recipient's caregiver. Briefly discussed option of Advanced/Voucher donation. Explained what to do with bills. Notified donor that this TC will email her [place]'s email address and will ask Victorino Dike to call donor.    Emailed donor:  ''Hello Brenda Horn,    Thank you for your interest in donation. As discussed, the paired Exchange Transplant Coordinator Brandon Melnick will be your contact person at [facility]. Victorino Dike is cc???d on this email. I have asked Victorino Dike to contact you to give you more information about evaluation as a Paired Exchange Donor.    Thank you,    Orest Dikes, RN, BSN''      Updated status to Accept. Transferred case to [facility] TC J. Victor Langenbach ([place])

## 2018-06-30 ENCOUNTER — Ambulatory Visit: Payer: Commercial Managed Care - HMO

## 2018-06-30 ENCOUNTER — Ambulatory Visit: Payer: PRIVATE HEALTH INSURANCE

## 2018-06-30 DIAGNOSIS — Z005 Encounter for examination of potential donor of organ and tissue: Secondary | ICD-10-CM

## 2018-06-30 NOTE — Telephone Encounter
Called Oran Rein Fay to discuss paired exchange and advance donation option. She verbalized understanding and responded that she would like to proceed with evaluation and defers her decision about ADP option at this time. Task to AA to schedule appointments. Route information to TCs [facility] [place] and Loney Laurence to advise of same.

## 2018-07-01 ENCOUNTER — Telehealth: Payer: PRIVATE HEALTH INSURANCE

## 2018-07-01 NOTE — Telephone Encounter
Left message in regards her eval appts that need to schedule for her. Wait for donor reply

## 2018-07-06 ENCOUNTER — Telehealth: Payer: PRIVATE HEALTH INSURANCE

## 2018-07-06 ENCOUNTER — Telehealth: Payer: Commercial Managed Care - HMO

## 2018-07-06 NOTE — Telephone Encounter
Left message to schedule her eval appts at [place]. Wait for donor reply

## 2018-07-06 NOTE — Telephone Encounter
Donor left message to schedule her eval appts

## 2018-07-06 NOTE — Telephone Encounter
Donor called to scheduel her eval for 6/5 diagnostic testing, 6/12 Zoom donor class, 6/19 neph and sw, 6/23 ILDA. Confirmed home and email address. I will call her once everything is scheduled

## 2018-07-07 ENCOUNTER — Telehealth: Payer: PRIVATE HEALTH INSURANCE

## 2018-07-07 NOTE — Telephone Encounter
S/w donor and confirmed her appts for 6/5,6/12, 6/19, and 6/23. I went over the time frame for each day. Informed her that donor class will be Zoom class and the coordinator will be sending her a zoom invitation.  NO COVID 19 questions and has not been around anyone who is test positive COVID 19/has not been traveling last two weeks(domestically/internationally).  I will send her the appt letter with collection container to her home address(verified) no question at this moment.

## 2018-07-23 ENCOUNTER — Inpatient Hospital Stay: Payer: PRIVATE HEALTH INSURANCE

## 2018-07-23 ENCOUNTER — Institutional Professional Consult (permissible substitution): Payer: PRIVATE HEALTH INSURANCE

## 2018-07-23 DIAGNOSIS — Z005 Encounter for examination of potential donor of organ and tissue: Secondary | ICD-10-CM

## 2018-07-23 MED ADMIN — SODIUM CHLORIDE 0.9 % IV BOLUS: 190 mL | INTRAVENOUS | @ 15:00:00 | Stop: 2018-07-23 | NDC 00338004902

## 2018-07-23 MED ADMIN — IOHEXOL 350 MG/ML IV SOLN: 120 mL | INTRAVENOUS | @ 15:00:00 | Stop: 2018-07-23 | NDC 00407141476

## 2018-07-24 ENCOUNTER — Ambulatory Visit: Payer: Commercial Managed Care - HMO

## 2018-07-30 ENCOUNTER — Ambulatory Visit: Payer: Commercial Managed Care - HMO

## 2018-08-05 ENCOUNTER — Ambulatory Visit: Payer: Commercial Managed Care - HMO

## 2018-08-06 ENCOUNTER — Telehealth: Payer: PRIVATE HEALTH INSURANCE

## 2018-08-06 ENCOUNTER — Telehealth: Payer: Commercial Managed Care - HMO

## 2018-08-06 DIAGNOSIS — R7301 Impaired fasting glucose: Secondary | ICD-10-CM

## 2018-08-06 DIAGNOSIS — Z005 Encounter for examination of potential donor of organ and tissue: Secondary | ICD-10-CM

## 2018-08-06 NOTE — Telephone Encounter
Brenda Horn sent message via my chart to advise that she spoke with her family after her June 5th appointment, and her parents shared that her grandfather was diagnosed with bladder cancer earlier this year and she did not include it on the questionnaire. Her parents shared some other family medical history that she is unsure is relevant, but is available to provide information as needed. Forwarded message to Dr. Rachel Bo to advise of same and to request follow-up discussion with Brenda Horn if needed for additional assessment as per her discretion.

## 2018-08-06 NOTE — Consults
Kidney Donor History and Physical    CC: Evaluation for kidney donation:     HPI: Brenda Horn is a 33 y.o. 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    she does  have insurance and her primary care physician is Marylouise Stacks, MD       Past Medical History:   Negative ZOX:WRUEAV disease, kidney stones, blood in urine, UTIs, bladder/prostate problems, heart attack, stroke, heart problems other, heart failure, Hypertension, Diabetes Mellitus, high cholesterol, PVD, cancer, chronic infections, bleeding disorders, depression or other psychiatric illness   Past Medical History:   Diagnosis Date   ??? Allergic rhinitis    ??? Cervical high risk HPV (human papillomavirus) test positive     Last HPV 2014 negative   ??? Eczema 10/21/2013   ??? Migraine    ??? Migraine with aura 10/21/2013       she has  never been diagnosed with or exposed to TB, or lived in the same household as someone diagnosed with TB. 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 she has  never been homeless      she has  never lived in the [place], [place], [place], [place], [place], [place], [place], [place], [place], [place]    Past Surgical History:   Past Surgical History:   Procedure Laterality Date   ??? wisdom tooth removal         she not did not have problem with anesthesia or bleeding      No Known Allergies     Functional status:   she can climb a flight of stairs, walk a mile, jog and play recreational sports    Family History:   Negative for kidney disease, Kidney stones, Heart disease, Diabetes, Hypertension, Cancer unless mentioned below   Family History   Problem Relation Age of Onset   ??? Migraines Father    ??? Migraines Paternal Grandmother ??? Uterine cancer Maternal Aunt        she has no bothers, no sisters and no children    Social History:   she is  married. she lives with her source  and does have good social support.  Tobacco Use   ??? Smoking status: Never Smoker   Substance and Sexual Activity   ??? Alcohol use: Yes     Alcohol/week: 0.6 oz     Types: 1 Standard drinks or equivalent per week   ??? Drug use: Yes   ??? Sexual activity: Yes     Partners: Male     Birth control/protection: OCP, Oral Contraceptive Pill     she works as higher Public affairs consultant    Outpatient Medications Prior to Visit   Medication Sig   ??? ergocalciferol (ERGOCALCIFEROL) 50000 units capsule Take 1 capsule (50,000 Units total) by mouth once a week   ??? loratadine 10 mg tablet Take 10 mg by mouth daily.     No facility-administered medications prior to visit.      Medications and supplements reviewed; no pharmacy consult indicated    Health Care Maintenance:   Last physical exam 2019,  last colonoscopy Na, last mammogram Na, last papsmear March 2019, last menstrual period  On birth control pill    she  is not pregnant and currently not planning to become pregnant for the next year   she has had no pregnancy/ies,    Review of Systems:   Constitutional: Negative for  fevers, chills, fatigue, loss of appetite, weight gain or loss, night sweats  Eyes: Negative for dryness, redness, blurry vision, loss of vision  Ear, Nose, Throat: Negative for hearing problems, runny nose, mouth dryness, problems swallowing, dental problems  Respiratory: Negative for cough, shortness of breath, sputum, bloody sputum  Cardiovascular: Negative for chest pain or palpitations  Gastrointestinal: Negative for  nausea, vomiting, diarrhea, constipation, abdominal pain, blood in stool  Genitourinary: Negative for urinary frequency, urgency, incontinence, burning, pain, or bleeding; Negative for flank pain, rash, or itching  Skin: Negative for rash or itching Musculoskeletal: Negative for arthritis or muscle pain  Neurologic: Negative for motor weakness, numbness, tingling, or seizures  Psychiatric: Negative for depression, mood swings, mania, or anxiety  Hematologic: Negative for easy bleeding or bruising, anemia, or blood clots  Endocrine: Negative for thyroid problems, hot or cold intolerance, or diabetes  Immunologic: Negative for chronic infection, hay fever, eczema     Physical Exam:                            June 2020  BP 125/67 124/75   Heart Rate 75 78   Resp 16 --   Temp 37.1 ???C (98.7 ???F) 36.7 ???C (98 ???F)   Temp Source Oral Tympanic   SpO2 98 % --   Weight 177 lb 11.2 oz (80.6 kg) 142 lb (64.4 kg)   Height -- 5' 7'' (1.702 m)           Lab   Results for SAKURA, DENIS (MRN [id number]) as of 08/06/2018 11:45   Ref. Range 07/23/2018 06:32   White Blood Cell Count Latest Ref Range: 4.16 - 9.95 x10E3/uL 5.53   Hemoglobin Latest Ref Range: 11.6 - 15.2 g/dL 45.4   Hematocrit Latest Ref Range: 34.9 - 45.2 % 38.5   Red Blood Cell Count Latest Ref Range: 3.96 - 5.09 x10E6/uL 4.13   Mean Corpuscular Volume Latest Ref Range: 79.3 - 98.6 fL 93.2   Mean Corpuscular Hemoglobin Latest Ref Range: 26.4 - 33.4 pg 30.8   MCH Concentration Latest Ref Range: 31.5 - 35.5 g/dL 09.8   Red Cell Distribution Width-SD Latest Ref Range: 36.9 - 48.3 fL 44.1   Red Cell Distribution Width-CV Latest Ref Range: 11.1 - 15.5 % 12.9   Platelet Count, Auto Latest Ref Range: 143 - 398 x10E3/uL 335     Results for DONIQUA, SAXBY (MRN [id number]) as of 08/06/2018 11:45   Ref. Range 07/23/2018 06:32   Sodium Latest Ref Range: 135 - 146 mmol/L 139   Potassium Latest Ref Range: 3.6 - 5.3 mmol/L 4.0   Chloride Latest Ref Range: 96 - 106 mmol/L 103   Total CO2 Latest Ref Range: 20 - 30 mmol/L 24   Anion Gap Latest Ref Range: 8 - 19 mmol/L 12   Urea Nitrogen Latest Ref Range: 7 - 22 mg/dL 10   Creatinine Latest Ref Range: 0.60 - 1.30 mg/dL 2.95 GFR Est.for African American Latest Ref Range: See GFR Additional Information mL/min/1.64m2 >89   GFR Est.for Non-African Americ Latest Ref Range: See GFR Additional Information mL/min/1.64m2 >89   GFR Additional Information Unknown See Comment   Cystatin C Latest Ref Range: 0.5 - 1.2 mg/L 0.7   eGFR by Cystatin C Latest  Ref Range: >=60 mL/min/BSA 110   Uric Acid Latest Ref Range: 2.9 - 7.0 mg/dL 4.3   Glucose Latest Ref Range: 65 - 99 mg/dL 454 (H)   Hemoglobin U9W Latest Ref Range: <5.7 % 5.4   Calcium Latest Ref Range: 8.6 - 10.4 mg/dL 9.0   Phosphorus Latest Ref Range: 2.3 - 4.4 mg/dL 3.9   Cholesterol Latest Ref Range: See Comment mg/dL 119   Cholesterol, HDL Latest Ref Range: >50 mg/dL 68   Cholesterol,LDL,Calc Latest Ref Range: <100 mg/dL 147 (H)   Non-HDL,Chol,Calc Latest Ref Range: <130 mg/dL 829   Triglycerides Latest Ref Range: <150 mg/dL 82   AST (SGOT) Latest Ref Range: 13 - 47 U/L 13   ALT (SGPT) Latest Ref Range: 8 - 64 U/L 23   Alkaline Phosphatase Latest Ref Range: 37 - 113 U/L 58   Bilirubin,Total Latest Ref Range: 0.1 - 1.2 mg/dL 0.2   Albumin Latest Ref Range: 3.9 - 5.0 g/dL 4.2   TOTAL PROTEIN Latest Ref Range: 6.1 - 8.2 g/dL 7.2   Pregnancy Test,Blood Latest Ref Range:    Negative   HBS Antigen Latest Ref Range: Nonreactive  Nonreactive   Hep B Surface Ab Quant Latest Ref Range: <10 IU/L 151   Hep B CORE Ab,IgM Latest Ref Range: Nonreactive  Nonreactive   Hep B Core Ab,Total Latest Ref Range: Nonreactive  Nonreactive   HCV Antibody Screen Latest Ref Range: Nonreactive  Nonreactive   CMV IgG Latest Ref Range: Negative  Positive   CMV IgM Latest Ref Range: Negative  Negative   EBNA-1 IgG Ab Latest Ref Range: Negative  Positive   EBV-VCA IgM Latest Ref Range: Negative  Negative   RPR Latest Ref Range: Nonreactive  Nonreactive   MTB-Quantiferon-Gold ELISA Latest Ref Range: Negative  Negative   Cocci IgG EIA Latest Ref Range: <0.150  <0.150   Cocci IgM EIA Latest Ref Range: <0.150  <0.150 Results for NADINA, FOMBY (MRN [id number]) as of 08/06/2018 11:45   Ref. Range 07/23/2018 06:32   Sodium Latest Ref Range: 135 - 146 mmol/L 139   Potassium Latest Ref Range: 3.6 - 5.3 mmol/L 4.0   Chloride Latest Ref Range: 96 - 106 mmol/L 103   Total CO2 Latest Ref Range: 20 - 30 mmol/L 24   Anion Gap Latest Ref Range: 8 - 19 mmol/L 12   Urea Nitrogen Latest Ref Range: 7 - 22 mg/dL 10   Creatinine Latest Ref Range: 0.60 - 1.30 mg/dL 6.57   GFR Est.for African American Latest Ref Range: See GFR Additional Information mL/min/1.24m2 >89   GFR Est.for Non-African Americ Latest Ref Range: See GFR Additional Information mL/min/1.40m2 >89   GFR Additional Information Unknown See Comment   Cystatin C Latest Ref Range: 0.5 - 1.2 mg/L 0.7   eGFR by Cystatin C Latest Ref Range: >=60 mL/min/BSA 110   Uric Acid Latest Ref Range: 2.9 - 7.0 mg/dL 4.3   Glucose Latest Ref Range: 65 - 99 mg/dL 846 (H)   Hemoglobin N6E Latest Ref Range: <5.7 % 5.4   Calcium Latest Ref Range: 8.6 - 10.4 mg/dL 9.0   Phosphorus Latest Ref Range: 2.3 - 4.4 mg/dL 3.9   Cholesterol Latest Ref Range: See Comment mg/dL 952   Cholesterol, HDL Latest Ref Range: >50 mg/dL 68   Cholesterol,LDL,Calc Latest Ref Range: <100 mg/dL 841 (H)   Non-HDL,Chol,Calc Latest Ref Range: <130 mg/dL 324   Triglycerides Latest Ref Range: <150 mg/dL 82   AST (SGOT) Latest Ref Range:  13 - 47 U/L 13   ALT (SGPT) Latest Ref Range: 8 - 64 U/L 23   Alkaline Phosphatase Latest Ref Range: 37 - 113 U/L 58   Bilirubin,Total Latest Ref Range: 0.1 - 1.2 mg/dL 0.2   Albumin Latest Ref Range: 3.9 - 5.0 g/dL 4.2   TOTAL PROTEIN Latest Ref Range: 6.1 - 8.2 g/dL 7.2   Pregnancy Test,Blood Latest Ref Range:    Negative   HBS Antigen Latest Ref Range: Nonreactive  Nonreactive   Hep B Surface Ab Quant Latest Ref Range: <10 IU/L 151   Hep B CORE Ab,IgM Latest Ref Range: Nonreactive  Nonreactive   Hep B Core Ab,Total Latest Ref Range: Nonreactive  Nonreactive HCV Antibody Screen Latest Ref Range: Nonreactive  Nonreactive   CMV IgG Latest Ref Range: Negative  Positive   CMV IgM Latest Ref Range: Negative  Negative   EBNA-1 IgG Ab Latest Ref Range: Negative  Positive   EBV-VCA IgM Latest Ref Range: Negative  Negative   RPR Latest Ref Range: Nonreactive  Nonreactive   MTB-Quantiferon-Gold ELISA Latest Ref Range: Negative  Negative   Cocci IgG EIA Latest Ref Range: <0.150  <0.150   Cocci IgM EIA Latest Ref Range: <0.150  <0.150   Results for KEIKO, MYRICKS (MRN [id number]) as of 08/06/2018 11:45   Ref. Range 07/22/2018 07:20   Creatinine Conc,ur Latest Ref Range: No Reference Range mg/dL 62.1   Albumin,urine Latest Ref Range: No Reference Range mg/L <12.0   Protein,Urine Latest Ref Range: No Reference Range mg/dL 7   Protein,Quant,Ur Latest Ref Range: 0 - 250 mg/24 hr 105   Normalized Creat Cl Latest Ref Range: 75 - 115 mL/min/1.32m2 122 (H)   Results for NATTALY, YEBRA (MRN [id number]) as of 08/06/2018 11:45   Ref. Range 07/23/2018 06:25   Urine Color Latest Ref Range:    Yellow   pH,Urine Latest Ref Range: 5.0 - 8.0  5.5   Specific Gravity Latest Ref Range: 1.005 - 1.030  1.024   Blood,Dipstick Latest Ref Range: Negative  Negative   Bili,Dipstick Latest Ref Range: Negative  Negative   Glucose,Random Urine Latest Ref Range: Negative  Negative   Ketones Latest Ref Range: Negative  Negative   Protein Latest Ref Range: Negative  Trace (A)   Nitrite Latest Ref Range: Negative  Negative   Leukocyte Esterase Latest Ref Range: Negative  2+ (A)   RBC per uL Latest Ref Range: 0 - 11 cells/uL 10   WBC per uL Latest Ref Range: 0 - 22 cells/uL 65 (H)   RBC per HPF Latest Ref Range: 0 - 2 cells/HPF 2   WBC per HPF Latest Ref Range: 0 - 4 cells/HPF 13 (H)   Bacteria Latest Ref Range: Absent  Present (A)   Squamous Epi Cells Latest Ref Range: 0 - 17 cells/uL 56 (H)   Results for GIZELLE, WHETSEL (MRN [id number]) as of 08/06/2018 11:45   Ref. Range 07/23/2018 06:32 Sodium Latest Ref Range: 135 - 146 mmol/L 139   Potassium Latest Ref Range: 3.6 - 5.3 mmol/L 4.0   Chloride Latest Ref Range: 96 - 106 mmol/L 103   Total CO2 Latest Ref Range: 20 - 30 mmol/L 24   Anion Gap Latest Ref Range: 8 - 19 mmol/L 12   Urea Nitrogen Latest Ref Range: 7 - 22 mg/dL 10   Creatinine Latest Ref Range: 0.60 - 1.30 mg/dL 2.84   GFR Est.for African American Latest Ref Range: See GFR Additional Information mL/min/1.19m2 >  89   GFR Est.for Non-African Americ Latest Ref Range: See GFR Additional Information mL/min/1.66m2 >89   GFR Additional Information Unknown See Comment   Cystatin C Latest Ref Range: 0.5 - 1.2 mg/L 0.7   eGFR by Cystatin C Latest Ref Range: >=60 mL/min/BSA 110   Uric Acid Latest Ref Range: 2.9 - 7.0 mg/dL 4.3   Glucose Latest Ref Range: 65 - 99 mg/dL 454 (H)   Hemoglobin U9W Latest Ref Range: <5.7 % 5.4   Calcium Latest Ref Range: 8.6 - 10.4 mg/dL 9.0   Phosphorus Latest Ref Range: 2.3 - 4.4 mg/dL 3.9   Cholesterol Latest Ref Range: See Comment mg/dL 119   Cholesterol, HDL Latest Ref Range: >50 mg/dL 68   Cholesterol,LDL,Calc Latest Ref Range: <100 mg/dL 147 (H)   Non-HDL,Chol,Calc Latest Ref Range: <130 mg/dL 829   Triglycerides Latest Ref Range: <150 mg/dL 82   AST (SGOT) Latest Ref Range: 13 - 47 U/L 13   ALT (SGPT) Latest Ref Range: 8 - 64 U/L 23   Alkaline Phosphatase Latest Ref Range: 37 - 113 U/L 58   Bilirubin,Total Latest Ref Range: 0.1 - 1.2 mg/dL 0.2   Albumin Latest Ref Range: 3.9 - 5.0 g/dL 4.2   TOTAL PROTEIN Latest Ref Range: 6.1 - 8.2 g/dL 7.2   Pregnancy Test,Blood Latest Ref Range:    Negative   HBS Antigen Latest Ref Range: Nonreactive  Nonreactive   Hep B Surface Ab Quant Latest Ref Range: <10 IU/L 151   Hep B CORE Ab,IgM Latest Ref Range: Nonreactive  Nonreactive   Hep B Core Ab,Total Latest Ref Range: Nonreactive  Nonreactive   HCV Antibody Screen Latest Ref Range: Nonreactive  Nonreactive   CMV IgG Latest Ref Range: Negative  Positive CMV IgM Latest Ref Range: Negative  Negative   EBNA-1 IgG Ab Latest Ref Range: Negative  Positive   EBV-VCA IgM Latest Ref Range: Negative  Negative   RPR Latest Ref Range: Nonreactive  Nonreactive   MTB-Quantiferon-Gold ELISA Latest Ref Range: Negative  Negative   Cocci IgG EIA Latest Ref Range: <0.150  <0.150   Cocci IgM EIA Latest Ref Range: <0.150  <0.150     Imaging:   CXR: FINDINGS/IMPRESSION:  ???  TUBES AND LINES: None  ???  LUNGS: No focal consolidation.  ???  PLEURA: No pleural effusion.  No pneumothorax.  ???  MEDIASTINUM: Unremarkable cardiomediastinal silhouette.   ???  BONES AND SOFT TISSUES: No acute/suspicious osseous disease.     CT urogram:   Left kidney  Size: 11.6 x 4.4 cm  Renal artery: one, borderline early bifurcation 1.9 cm from the aorta.  Renal vein: one, no late confluence (within 1.5 cm of aorta)  ???  Calculi: None.  Lesions: None.  Collecting system: Normal.  ???  Right kidney  Size: 11.6 x 3.8 cm  Renal artery: one, no early bifurcation (behind IVC)  Renal vein: one, no late confluence (within 1 cm of the IVC)  ???  Calculi: None.  Lesions: None.  Collecting system: Normal.  ???  Urinary bladder: Normally distended. No abnormalities visualized.  ???  Additional Findings:  Lung bases: Unremarkable.  Liver: Normal density and contour. No solid mass.  Gallbladder and bile ducts: Normally distended gallbladder with no calcified gallstones or wall thickening. No biliary dilation.  Spleen: Unremarkable.  Pancreas: Unremarkable.  Adrenals: Unremarkable.  Bowel: Nondilated bowel with no wall thickening.  Reproductive organs: Normal sized uterus. 3.6 cm left ovarian cyst.  Lymph nodes: No lymphadenopathy.  Peritoneum: No free air, free fluid, or fluid collections.  Vessels: Unremarkable.  Abdominal wall: Unremarkable.  Bones: Unremarkable.  ???  ???  IMPRESSION:  ???  1. Left kidney: one renal artery with borderline early bifurcation; single vein with no late confluence. 2. Right kidney: one renal artery with no early bifurcation; single vein with no late confluence.   ???    Impression:   Evalena Fujii is a 33 y.o. years old female who wishes to donate a kidney.    BMI 27  Height wt 177 she was about 140Ib in 2015  impaired FBG  Diet recommendations provided  Wt loss encouraged about 10-15 Ib for her health    Nutritional assessment completed as part of patient assessment; dietician consult indicated for carb and calorie counseling    Donor states there are other donors under evaluation for her spouse at the moment  She wishes to save her kidney for future 2nd transplant for her spouse if possible  However if no other donor cleared she will proceed    All Informed Consent elements were discussed in detail with the donor. she confirmed with me that she is voluntarily willing to donate her kidney without feeling any pressure from the recipient or other treatment team. she is fully aware of illegality of transfer of human organ for valuable consideration.     I discussed with her regarding increased risk of preclampsia and hypertension after kidney donation. She is aware of the risk and still willing to proceed despite the potential risks    I explained to her that an independent living donor advocate is assigned to her who will promote her best interest and will advocate for her rights and is available to assist her in obtaining and understanding information regarding consent process, evaluation process, surgical process, medical and psychosocial risks and benefits and need for follow up. I discussed with this patient the need for followup both with [facility] (as per our routine) and with her primary doctor    I explained to her that her information are protected by HIPAA and will be kept confidential but if she becomes a donor her information will be sent to the [facility] and may be sent to the other places involved in the transplant process as permitted by law. I also informed her that any infectious disease or malignancy discovered during the first two years of the donor???s post-operative follow-up care that may affect the recipient???s care, may need to be reported to local, state, or federal public health authorities, will be disclosed to the recipient???s transplant center, and will be reported through the [facility] Patient Safety Portal    I also informed her that there is the possibility that her workup may reveal conditions that must be reported to public health authorities. In addition if she is considered an increased risk donor candidate, the recipient will have to be informed of that risk before they agree to proceed with the transplant     The National and center-specific 1-year patient/graft survival outcomes were reviewed with her. The fact that CMS outcome requirements are being met by [facility] was discussed with her.     I also explained to her that development of CKD and subsequent progression to ESRD maybe more rapid with only one kidney.  However, the current practice is to prioritize prior kidney donors who become kidney transplant candidates due to ESRD. she was informed that the risk of death may be impacted by obesity, high blood pressure or  other pre-existing conditions      We discussed about the formation of scar after donation and that its effect on her body image. We also discussed about possibility of hernia and nerve injury during the surgery and bowel obstruction and nausea post operatively. I explained to her that kidney donation may affect her ability to obtain certain types of employment and there may be potential for loss of employment or income due to donating and it may have a negative impact on her ability to obtain future employment. I also explained to her that there  are also inherent risks associated with being evaluated such as discovering a serious medical condition or adverse genetic findings. she was informed that as part of the work up we need to obtain a CT scan with contrast which she might have allergic reactions to the contrast media that is used the procedure. she is informed that she can be declined as a donor at any point in the evaluation process and also her recipient may be transplanted prior to their evaluation being completed or prior to living transplant. she is aware that the personal expense of travel, housing, child care, loss of wages related to donation might not be reimbursable. she understands and agrees that future problems or complications she experiences by donating may not be covered by recipient???s insurance and she may be responsible for those costs and she will need life-long follow-up with a physician at her own expense    We discussed about the available alternative procedures or courses of treatment for the recipient. she also confirmed with me that after donation she will commit to post-donation follow-up with [facility] as required by UN[facility] at specific time points    I could not find any general health/surgical risk including conditions that may predict future complications of having one kidney except mentioned above. she did not report any increased risk behavior (as reported by the [place] [facility]) unless stated above. she was also screened for the Transmissible and Endemic Disease such as CMV, EBV, HIV, HBV, HCV, Syphillis, TB, T. cruzi, Strongyloides, West Nile Virus and if the donor is at increased risk for exposure per above screening, recipient will be informed of general (not specific) increased risk of disease transmission and consent will be obtained accordingly.      Anatomy and function of the kidneys and general personal history of significant medical conditions such as: genetic kidney diseases; lung, heart, GI, GU, autoimmune, or neurologic diseases; hematologic or bleeding/clotting disorders; cancer; infections Kidney-specific personal and family history: HTN, diabetes including gestational diabetes, kidney disease, proteinuria, hematuria, kidney injury, nephrolithiasis, recurrent UTIs, kidney cancer were evaluated and not found in the potential donor unless mentioned above      Active and past medications were reviewed and no nephrotoxin medications was identified unless stated above. she was encouraged to avoid any nephrotoxic medications in the future such as NSAIDs, herbal medications if she becomes a donor. I recommended her to modify her lifestyle and encouraged her to more exercise and healthier diet post donation.     Donor???s risk for and family history of CAD and cancer was evaluated and was negative unless mentioned above       The ultimate decision regarding candidacy to donate will be determined in a multidisciplinary meeting after completion of all the required testing.     I personally spent a total of more than 60 minutes in the care of [place] Leigh Kossman.  More than 50% of this time was spent for  face-to-face counseling and/or coordinating care. Topics of my discussion are mentioned in my note.       Desma Paganini, MD    CC:  Marylouise Stacks, MD

## 2018-08-10 ENCOUNTER — Telehealth: Payer: Commercial Managed Care - HMO

## 2018-08-10 NOTE — Telephone Encounter
Per Dr. Rachel Bo additional evaluation needed in consideration for medical clearance of Brenda Horn, including 2 hour GTT d/t abnormal fasting blood sugar and is recommended weight loss 10-15 pounds. Dietician consult requested and will plan for glucose tolerance test once weight loss goal is obtained. Case status updated to inactive pending additional testing. Route information to TC J. Strejc to advise donor case update.

## 2018-08-11 NOTE — Consults
Patient Consent to Telehealth Questionnaire   [facility] TELEHEALTH PRECHECKIN QUESTIONS 08/10/2018   By clicking ''I Agree'', I consent to the below:  I Agree     - I agree  to be treated via a video visit and acknowledge that I may be liable for any relevant copays or coinsurance depending on my insurance plan.  - I understand that this video visit is offered for my convenience and I am able to cancel and reschedule for an in-person appointment if I desire.  - I also acknowledge that sensitive medical information may be discussed during this video visit appointment and that it is my responsibility to locate myself in a location that ensures privacy to my own level of comfort.  - I also acknowledge that I should not be participating in a video visit in a way that could cause danger to myself or to those around me (such as driving or walking).  If my provider is concerned about my safety, I understand that they have the right to terminate the visit.     LIVING KIDNEY DONOR SOCIAL WORK EVALUATION    REASON FOR CONSULTATION:  The potential donor was referred by the Kidney Donor Evaluation Team for a social work evaluation.    IDENTIFYING INFORMATION:  The patient, Brenda Horn, is a 33 y.o. married woman of [place] decent who presents today for a social work evaluation as a potential living kidney donor.    RELATIONSHIP BETWEEN DONOR AND RECIPIENT:  The potential donor wishes to donate a kidney via paired exchange/advance donation to benefit her husband Brenda Horn, who is 3 years old and pre-dialysis.  Her husband's kidney failure is ascribed to IgA Nephropathy.  He recently had a fistula paced for hemodialysis.    SOCIAL AND FAMILY HISTORY:  The patient was born in [place] and raised in [place], [place], and [place].  She currently resides in [place] with her husband Brenda Horn.  The couple have been together eight years and married for three years.  They do not have any children.  The potential donor may be open to having children in the future, depending on her husband's health.     The potential donor is an only child.       The  potential donor's mother Lowella Bandy is 80 years old, and her father Jillyn Hidden is 13 years old.  Her parents reside in [place] and are in general good health.    The patient describes a stable upbringing with no major stressors.  Although the family moved quite a bit while she was growing up, she self describes as an extrovert, making friends easily and  participating in extracurricular activities.    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  She has held this job since July.  Prior work experience was working for the [employer] at another college, and Aeronautical engineer college for three years.  She has a bachelor's degree on [place], and a Master's degree in [place] Literature.      She did not serve in the [employer].    The potential donor's health insurance is  [place]. Her primary care physician is Campbell Stall, MD.    The potential donor relates that her current financial situation is comfortable.    This Clinical research associate educated the potential donor regarding the need to be prepared financially for any potential reduction or loss of income while she recovers from kidney donation surgery. She was educated  about potential entitlements, such as state or private short term disability insurance ,and use of any private short term disability plan, if appropriate.  She was educated about Organ Donor Leave, and the potential to have 30 days of paid leave under this provision. She was advised that this benefit does vary from state to state and for individual employers.  She was encouraged to discuss this with her human resources department once she is approved as a donor and a potential surgery date is scheduled.    MOTIVATION OF DONOR: When queried as to why he wishes to be considered as a kidney donor for this recipient, she stated ''I see my husband's health decline on a daily basis. And because of his blood type it will be harder for him to get a deceased donor renal transplant (DDRT).''    She shares that he volunteered to be a kidney donor.  Per potential donor, she has been considering becoming a living donor since 2018.  He husband is also  listed for a DDRT in [place].    When asked if anyone else has volunteered to be evaluated as a donor for this recipient the potential donor states a few friends have expressed interest and are waiting to be tested in [place].    She was able to verbalize an awareness of the alternatives to living kidney donation, such as deceased donor renal transplant and the various modalities for dialysis.     The  potential donor denies any pressure or coercion related to donation.     ABILITY TO PROVIDE INFORMED CONSENT:  The potential donor was able to verbalize a good understanding of the laparoscopic kidney donation surgery, and the risks associated with hospital admission, surgery and general anesthesia. He was educated about the typical hospital length of stay, and the general parameters regarding recovery time for kidney donors.    OTHER SALIENT INFORMATION:  The potential donor shares that she can at times excessively worry about he own health, stating that she is ''a hypochondriac.''    DONOR EXPECTATION:  When asked if he understood the risks that organ recipient's face, the patient was able to verbalize an good understanding of the risk of rejection and surgical failure in the organ transplant population. She was educated about how organ rejection is prevented, detected, and treated.     When asked how she would feel if the transplanted organ was rejected or the surgery was not successful, the potential donor stated ''It would suck  We would  go back and start the process over.'' When asked if she would regret donating her kidney if this was the outcome was not a success, she stated that he did not believe that she would regret donating, regardless of the outcome.    ADVANCE DIRECTIVES  The potential donor was educated about advance directives for health care, and the importance of such a document for individuals facing surgery and hospitalization.  She would name her husband and her father as her agents. The potential donor was advised that should she choose to, she could complete a temporary advance directive upon admission to the hospital that would be valid for that admission only.    LIVING DONOR INCREASED RISK BEHAVIOR SCREENING FOR HIV, HEPATITIS B& C VIRUS TRANSMISSION:  This Clinical research associate has reviewed the patient's increased risk behavior questionnaire, and she does not meet criteria as an increased risk donor as put forth by the standards of the [employer].  FAMILY AND SOCIAL SUPPORT:  When asked if his family and the people who are important in her life feel about her being an organ donor, she related initially that her parents had ''no opinion'' but the stated that ''Cameron's health is most important.''  She shares that her strong friend base are supportive.    RECOVERY PLAN:  When asked who would take care of her during the recovery process, the potential donor states that if she does not do advance donation, her mother would care for her and Cameron's mother would care for him. She is aware that they will both need separate caregivers.    ABILITY TO COPE WITH STRESS:  The potential donor states that she copes with stress by playing with her dogs, her strong friend network, going to the beach, doing puzzles, and reading.     MENTAL HEALTH HISTORY:  The potential donor denies being diagnosed with a psychiatric illness.  She has not taken any medications for mood. She has been in bi-weekly counseling since January 2020 counseling, and previously was in counseling in [place] from January 2018 through June 2019, also bi-weekly.   Denies suicidal ideation, intent, or plan.    The potential donor shares that her sleep and appetite are both good.    SUBSTANCE/CONSEQUENCES:  Alcohol Use: The potential donor last had an alcoholic beverage two weeks ago when she had a mixed drink  Her typical consumption is one to two servings about twice a month.  Thr potential donor denies ever having a problem with alcohol, or experiencing any consequences secondary to alcohol consumption.     Drug Use: Denied    Family history of addiction/alcoholism:  Denied.     Tobacco Use:  Denied    LEGAL/ARREST HISTORY:  Denied.    ASSESSMENT:  The potential donor is alert and oriented in all spheres.  She presented as pleasant, calm, verbal and cooperative.  She displayed a broad range of affect.  Her eye contact was good. The potential donor was casually attired and well groomed. Thinking was linear and goal directed. Thought content focused on the desire to help her husband by donating a kidney.    There was no evidence of perceptual or thought disturbance.  Insight and judgement appear intact.    OUTCOME/RECOMMENDATIONS:  This potential donor presents as an acceptable candidate for kidney donation from a social work Cytogeneticist.  There are no concerns about coercion, power or manipulation or any inducements in her desire to donate a kidney to benefits.  She has stable housing, finances, employment, transportation and post donation support.  The potential donor understands the possible financial implications of living donation.    The potential donor was provided with this writer's contact information, and encouraged to call should she have any questions or concerns.    Tekelia Kareem Hersh-Rifkin, LCSW  Clinical Social Worker III  Living Donor Team  Kidney & Pancreas Transplant Program

## 2018-08-11 NOTE — Progress Notes
INDEPENDENT LIVING DONOR ADVOCATE CONSULTATION  REASON FOR CONSULTATION:  The potential donor was evaluated by the Independent Living Donor Advocate (ILDA) as part of the comprehensive education and evaluation process for all potential living kidney donors.   REFERRAL FROM:    The donor was referred by the Living Donor Program for donor evaluation.  IDENTIFYING INFORMATION:  Brenda Horn is a 33 year old married female,  who present as a candidate for living kidney donation to benefit her husband, who is diagnosed with End-Stage Renal Disease secondary to IGA Nephropathy.   RELATIONSHIP BETWEEN DONOR AND RECIPIENT:  Brenda Horn is the recipient's wife.  HOW DID DONOR LEARN ABOUT RECIPIENT'S CONDITION AND NEED FOR TRANSPLANT:  Brenda Horn reports she has been ''well aware'' of his condition for the past eight years.  Brenda Horn has known about the recipient's enrollment back in 2018 for transplant at another transplant center other than [facility].  Brenda Horn reports the recipient's mother has volunteered to donate, however, she is unable to donate due to antibodies.  The recipient's aunt has been declined and other family members are interested and are reaching out for possible work-up.  Additionally, the recipient's co-workers have volunteered, but they are not moving forward in the donation process.   UNDERSTANDING OF RISKS:  It is important for the prospective living donor to be provided with education of all aspects of the donation process and to help optimize understanding of the risks and benefits associated with being a living donor as well as center-specific risk factors.     DONOR RIGHTS:  Education of the prospective living donor regarding the consent process is completed to assure the donor is willing to donate out of  her own free will and free from inducement and coercion, as well as understand her right to decline to proceed or withdraw from the donation process at any time.  EDUCATION: At the beginning phase of the prospective donor evaluation, there will be visits/Tele Health/Zoom meetings or phone calls coordinated with the various Transplant providers to complete labs/evaluations/exams/tests.  The Nurse Coordinator will provide the prospective donor with in-depth education about donation.  The role of the ILDA will be to assist the prospective donor understand and obtain information from the Transplant team about the following:   DONATION PROCESS:  ? The donor's willingness to donate  ? Alternative treatments available for the recipient  ? Prioritization of all living donors on the transplant wait list  ? Immunological compatibility (blood group, cross-match, tissue typing/HLA, genetic match) between donor and recipient  ? Methods of donation (direct, kidney paired, compatible exchange, combined stem cell/kidney transplant, non-direct donation)   ? The consent process for all consultations and procedures pre and post donation  ? The evaluation process  ? The option to receive test results  ? The right to be informed about the Donor Selection Committee's decision and recommendations  ? Donor confidentiality and how donor's personal health information is protected through HIPAA  ? The risk of work-up revealing medical conditions that must be reported to Public Health agencies or work-up revealing incidental medical findings  ? The immediate, short and long-term risks of donation  ? Risks associated with surgery, hospital admission, anesthesia and potential complications  ? The immediate, short and long term expectations following donation  ? The ability to opt out of donation evaluation or surgery up to and including the time of admission and administration of anesthesia with no medical repercussions  ? Have the right to decline donation  without repercussion or judgement  ? Have the option to be provided with a 'Medical Alibi'    RISKS AND BENEFITS OF LIVING DONATION: ? Donor understands there is no financial gain or benefit to donating  ? Donor understands there are federal laws that prohibit the donor from receiving money, gifts and/or compensation of any sort to donate  ? It is illegal to transfer any human organ for any valuable consideration  ? Allergic reactions to contrast/dye  ? Discovery of reportable infections or diseases  ? Discovery of serious medical conditions  ? Discovery of adverse genetic findings unknown to the donor  ? Discovery of certain abnormalities that will require more testing at the donor's expense or create the need for unexpected decisions on the part of the transplant team    POTENTIAL MEDICAL RISKS:  ? Decreased kidney function  ? Risk of developing chronic kidney disease or kidney failure leading to the need for dialysis or kidney transplant  ? Development of other unforeseen medical conditions/diagnosis  ? General health or surgical risks including conditions that may predict future complications of having one kidney    POTENTIAL SURGICAL RISKS:  ? Death (3 in 10,000)  ? Scars, hernia, infection (wound) or otherwise, blood clots, pneumonia, nerve injury, pain, fatigue, bleeding, and other consequences typical of any surgical procedure  ? Abdominal bloating, nausea, or other abdominal symptoms as well as bowel obstruction  ? The donor's morbidity and mortality may be impacted by obesity, hypertension, or other donor-specific pre-existing conditions    POTENTIAL PSYCHOSOCIAL RISKS:  ? Problems with body image  ? Post-surgery depression and anxiety  ? Potential to experience emotional distress or grief if the recipient experiences organ rejection, has to be re-transplanted or death  ? Experience distress with recipient recurrence of medical conditions/diseases  ? Lifestyle changes post donation    POTENTIAL FINANCIAL IMPACT:  ? Incur personal expenses for travel, lodging, child/elder care costs and lost wages related to donation might not be reimbursed, however, resources might be available to defray some of the donation-related costs  ? Life-long follow up at the donor's expense  ? Loss of employment or income  ? Potential impact on future ability to obtain health or life insurance; the ability to pursue certain careers such as Financial planner work, Patent examiner, or work that involves use of Interior and spatial designer and/or physical demands  ? Negative impact on the ability to obtain, maintain, or afford health insurance, disability insurance and life insurance  ? Future health problems experienced by living donors following donation may not be covered by the recipient's insurance    POST SURGERY:  ? The agreement the prospective donor will commit to post-operative follow-up, with test/labs/exams as require by [facility] at specific time points for a minimum of two years with her Primary Medical Doctor (PMD) or at [facility] at no cost  ? Disclose that transplant programs are required to report living donor follow-up information for at least two-years, and donors should expect to be contacted regarding their current health status.  ? Maintenance of a healthy lifestyle and practices that are kidney protective.      BENEFITS AND RISKS OF TRANSPLANT & ALTERNATIVE TREATMENTS FOR THE RECIPIENT:  ? The recipient will not need dialysis & will likely have improved health with the transplant  ? The recipient's wait time is often times shorter if the donor is a living donor  ? The recipient has a better outcome when the wait time is shorter   ?  Recipients receiving living donor organ may experience fewer side effects and may live longer post-transplant  ? Kidney failure occurs in less than 5% of the transplants in the first year following transplantation  ? The recipient may reject the kidney and may have to go to the wait list for re-transplant  ? The alternative treatment options to the recipient is to go or stay on dialysis ? May wait for a Non-living donation    MOTIVATION TO DONATE:  Brenda Horn states the reason why she is willing to be a donor is because she wants him to ''stay around.''  She further states ''why wouldn't I help him, if I can.''  Brenda Horn reports it has been ''hard to see the physical decline.''  He is getting more ''tired,'' and although he has not yet started dialysis they are preparing in the event that he needs it.  She reports wanting him to feel better and being able to put this ''behind [place].''  COERCION/INDUCEMENT/PRESSURE TO DONATE:  The donor was evaluated to determine if she is free of inducement, coercion and other undue pressures by assessing the reasons for donating and the nature of her relationship to the transplant recipient.  Brenda Horn has known of her husband's medical condition and she has witnessed what she reports is a physical decline that has been difficult for her to see, thus she wants to help him have better quality of life by volunteering to donate a kidney.    She denies feeling pressured to donate.  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  She states her husband prefers to have someone other than her be the donor, but he is supportive to her if she is the best available donor.   FINANCIAL MOTIVATION/GAIN:  Brenda Horn denies any financial motivation for wanting to be a donor and she does not feel influenced or pressured by anyone to donate.  The donor understands there are Federal laws that prohibit donors from receiving money, gifts, accept credit card pay off and/or compensation of any sort to donate.  Brenda Horn is aware that she may not accept payment as reviewed for serving as a living donor.  Brenda Horn has been informed and understands that it is illegal to transfer any human organ for any valuable consideration. DECISION MAKING/SUPPORT/READINESS:  The donor reports having had discussion with her husband, who is supportive, but would like to have someone else be ''first choice.''  She reports her parents are aware of her decision to want to donate, but they don't discuss the topic because this is the way her parents are.  She doesn't feel that there is any opposition, instead there are preferences to have someone other than her be ''first choice'' to be his donor.  The potential impact of family or other external pressures on Brenda Horn's decision have been discussed and evaluated.  There are no immediate barriers that would preclude this donor from continuing through the process of candidacy.  Brenda Horn reports she will need a bit of time to be ready for donation, if it is determined that she is the best available option for donation.  She has been informed about the need to discuss further discuss her availability with her Nurse Coordinator to determine the best available time if she is cleared for donation.  REPORTABLE DISEASES/MALIGNANCIES:  Brenda Horn and this ILDA reviewed that infectious diseases such as HIV, Hep B, and/or Hep C are reportable diseases to local, state or [employer] authorities.  Brenda Horn verbalized understanding that if these diseases or any malignancies are discovered within the first two years post donation it may affect the recipients care and it may need to be reported to [facility], the recipients treatment center and will be reported through the [facility].  CANDIDACY RECOMMENDATIONS:  At this time this donor has excellent understanding of the concepts discussed during this consultation.   Brenda Horn is able to demonstrate ability to make an informed decision for donation.  She was interested and attentive to information and education provided.  She had clear understanding of the risks and benefits to herself and to her husband. She asked appropriate questions. There are no current concerns that would preclude this donor from being considered for donation. The donor presents as a suitable candidate to donate.    IMPRESSIONS:  The role of the ILDA has been reviewed at the beginning of this consultation.  Brenda Horn has clear understanding of this ILDA's role as she verbalized her understanding.  Brenda Horn has met with the Living Donor Transplant Nephrologist, Nurse Coordinator, Social Worker, who all have discussed the  immediate, short and long term benefits and risks associated with living donation.  My role has been to assess her level of understanding of the risks, benefits and potential burdens regarding the donation process.  Ms. Alcantar understands that serving as a living kidney donor is elective, and there are no medical benefits to undergoing donation surgery.  We discussed the informed consent process, the evaluation process, the surgical procedure, the potential medical, surgical, psychosocial, financial risks and benefits, and the required post donation follow-up.   She was able to verbalize understanding of the specific areas by describing the potential risks and benefits to herself and to the recipient/her husband.  She has clear knowledge of the potential risk of rejection to her husband, either from recurrent disease or post transplant.  She understands that if this occurs the recipient team will evaluate and treat.  If rejections is unpreventable, Ms. Bachtel understands the recipient may need to get referred to dialysis and or re-transplantation from living or deceased donor.    Ms. Clarkston understands the risks and benefits discussed are not only limited to the specific ones we have reviewed during this consultation, but there are various risks and benefits to the living donor and recipient that have been discussed and reviewed by the Living Donor Transplant team.  Ms. Henrickson understands and has acknowledged understanding of the different components of informed consent and she wishes to proceed.    We discussed the protection of her health/medical information/HIPAA protection.  She has been informed the Living Donor Transplant Program will not release or share information about her work-up, evaluation or test results to anyone, including the recipient, without her consent or permission, other than for the purpose of mandatory reporting to public health agencies.  Ms. Scalzo understands she has the right to withdraw or 'opt-out' from the donation process at any given time up until surgery and she can be provided with a general statement of unsuitability such as a 'medical-alibi.'  We discussed and reviewed the Selection Committee's process of determining donor suitability and Ms. Abdon demonstrates understanding this information.  Ms. Mcneese will receive information regarding suitability to donate from her assigned Nurse Coordinator.  If Ms. Jiggetts is  medically suitable for donation, as the Independent Living Donor Advocate, I see there are no current contraindications that Ms. Nold be able to donate to benefit  her husband.    FOLLOW UP:  Ms. Joswick has been informed about the required commitment to have post donation follow care 10 days following donation and at the intervals of six months, one year and two years in order to ensure she remains in good health.  She acknowledges the importance of follow-up care and she agrees to attend all follow-up appointments/Tele Health or to complete them at her local Primary Care Physician's (PCP) clinic and have lab result forwarded to [facility] Donor Program.   CAREGIVER:  Ms. Spiewak understands that she is not a match to her husband and if she donates, she will be a non-direct donor.  She has thought about wanting to donate in advance in order to have her husband be her caregiver post donation.  If this is not possible, she has a back up plan to have her mom be her caregiver.   REFERRALS/RECOMMENDATIONS:  Ms. Cartelli has been recommended by the Nephrologist to lose 10-20 lbs and she reports having been counseled by the Nephrologist about her slightly high glucose level of 106.  She has been informed that the timing based on the medical recommendation given to her will need to be further discussed with her Nurse Coordinator, which she will plan to do.  Ms. Hada understands we will meet in person at the Pre-Operative clinic visit and once again before she is discharged from the hospital post donation.  PLAN:  ? Remain available to the donor to provide ongoing support and education as to the donation process   ? Follow up with the donor in the hospital post donation.    Ms. Schraeder has this ILDA's contact information and she has been encouraged to call should she have any questions or concerns.

## 2018-08-12 ENCOUNTER — Telehealth: Payer: PRIVATE HEALTH INSURANCE

## 2018-08-12 NOTE — Telephone Encounter
Sent Francene Boyers additional evaluation letter via my chart, including weight loss recommendation and glycemic labs post weight loss. Dietician consult pending.

## 2018-08-13 ENCOUNTER — Telehealth: Payer: Commercial Managed Care - HMO

## 2018-08-13 NOTE — Telephone Encounter
Nutrition note:   -RD called and left message for pt to call back as appropriate ( re: donor nutrition consult).  Karrington Studnicka, RD [id number]

## 2018-08-17 ENCOUNTER — Telehealth: Payer: PRIVATE HEALTH INSURANCE

## 2018-08-17 NOTE — Telephone Encounter
Nutrition note:   -RD called and left message the 2nd time for pt to call back as appropriate.  -RD also sent an email reminder. RD will stop calling and wait for pt to call back at this time.    Ida Rogue, [place]

## 2018-08-27 ENCOUNTER — Telehealth: Payer: PRIVATE HEALTH INSURANCE

## 2018-08-27 NOTE — Telephone Encounter
Kidney and Pancreas Transplant Program   Telephone Consult: Nutrition Note  PATIENT: Brenda Horn     MRN: [id number] DOB: May 03, 1985   DATE OF SERVICE: 08/27/2018  Reason for Telephone Call:   RD spoke with Brenda Horn regarding lifestyle modification for better FBG and wt management per Dr. Theron Horn request. Pt plans to donate one kidney for husband through paired exchange.  Objective:        Medical History: Past Medical History:   Diagnosis Date   ??? Allergic rhinitis    ??? Cervical high risk HPV (human papillomavirus) test positive     Last HPV 2014 negative   ??? Eczema 10/21/2013   ??? Migraine    ??? Migraine with aura 10/21/2013        Surgical History: Past Surgical History:   Procedure Laterality Date   ??? wisdom tooth removal        Medications:   Current Outpatient Medications:   ???  ergocalciferol (ERGOCALCIFEROL) 50000 units capsule, Take 1 capsule (50,000 Units total) by mouth once a week, Disp: 12 capsule, Rfl: 0  ???  loratadine 10 mg tablet, Take 10 mg by mouth daily., Disp: , Rfl:    Vitals: Vitals - 1 value per visit 07/23/2018   SYSTOLIC 125   DIASTOLIC 67   PULSE 75   TEMPERATURE 98.7   RESPIRATIONS 16   Weight (lb) 177.7   HEIGHT -   VISIT REPORT -   BMI 27.83 kg/m2   SPO2 98      Labs:    Lab Results   Component Value Date    WBC 5.53 07/23/2018    HGB 12.7 07/23/2018    HCT 38.5 07/23/2018    PLT 335 07/23/2018                                                      Lab Results   Component Value Date    NA 139 07/23/2018    K 4.0 07/23/2018    CO2 24 07/23/2018    BUN 10 07/23/2018    GLUCOSE 104 (H) 07/23/2018    CALCIUM 9.0 07/23/2018    PHOS 3.9 07/23/2018    CREAT 0.70 07/23/2018    CHOL 190 07/23/2018   LDL 106 ( 07/23/2018)             Anthropometrics:  Wt Readings from Last 3 Encounters:   07/23/18 177 lb 11.2 oz (80.6 kg)   10/21/13 142 lb (64.4 kg)   12/30/12 138 lb 6.4 oz (62.8 kg)   -Dr. Theron Horn would like pt to lose 10-15#.      Nutrition Related Issues and Assessment -Food recall:                     Breakfast: coffee with fruit and yogurt                    Lunch: previous night leftover ( eg pasta)                    Dinner: dumplings                    Snack: occasionally ice-cream  -Exercise: no routine, but walk with dogs 1 mile from time to time  -Alcohol use: none; [place]  juice in the morning sometimes  Nutrition Plan and Education:   -RD reviewed with pt controlled carb/portions with lean protein, fiber-rich foods x 3 times/day with the focus on daily routine exercise to support gradual wt loss and optimize A1C levels.  -Rd pointed out whole grain food for pt to consider. RD encouraged pt to set up walking routine- eg 1 mile walking twice /day.   -All nutrition related questions were answered and pt stated no further questions at this time.  Recommendations/Goals:   RD set the goal with pt: to gradually lose wt to goal and optimize nutrition related labs  RD remains available for pt.    Author: Marliss Coots, MS, RD, CSR, CDCES, CNSC       Kidney & Pancreas Transplant Dietitian  Pager [phone]
